# Patient Record
Sex: Male | Born: 1945 | Race: White | Hispanic: No | Marital: Married | Smoking: Former smoker
Health system: Southern US, Community
[De-identification: ages and names within clinical notes are randomized; demographics above are authoritative.]

## PROBLEM LIST (undated history)

## (undated) DIAGNOSIS — T8859XA Other complications of anesthesia, initial encounter: Secondary | ICD-10-CM

## (undated) DIAGNOSIS — J189 Pneumonia, unspecified organism: Secondary | ICD-10-CM

## (undated) DIAGNOSIS — M1990 Unspecified osteoarthritis, unspecified site: Secondary | ICD-10-CM

## (undated) DIAGNOSIS — C61 Malignant neoplasm of prostate: Secondary | ICD-10-CM

## (undated) DIAGNOSIS — E119 Type 2 diabetes mellitus without complications: Secondary | ICD-10-CM

## (undated) DIAGNOSIS — T4145XA Adverse effect of unspecified anesthetic, initial encounter: Secondary | ICD-10-CM

## (undated) DIAGNOSIS — S52501A Unspecified fracture of the lower end of right radius, initial encounter for closed fracture: Secondary | ICD-10-CM

## (undated) HISTORY — PX: TONSILLECTOMY AND ADENOIDECTOMY: SUR1326

## (undated) HISTORY — PX: PROSTATECTOMY: SHX69

## (undated) HISTORY — PX: CERVICAL SPINE SURGERY: SHX589

---

## 1998-02-25 ENCOUNTER — Other Ambulatory Visit: Admission: RE | Admit: 1998-02-25 | Discharge: 1998-02-25 | Payer: Self-pay | Admitting: Urology

## 2000-10-13 ENCOUNTER — Emergency Department (HOSPITAL_COMMUNITY): Admission: EM | Admit: 2000-10-13 | Discharge: 2000-10-13 | Payer: Self-pay | Admitting: Emergency Medicine

## 2001-04-06 ENCOUNTER — Emergency Department (HOSPITAL_COMMUNITY): Admission: EM | Admit: 2001-04-06 | Discharge: 2001-04-06 | Payer: Self-pay | Admitting: Emergency Medicine

## 2001-04-25 ENCOUNTER — Ambulatory Visit: Admission: RE | Admit: 2001-04-25 | Discharge: 2001-07-24 | Payer: Self-pay | Admitting: Radiation Oncology

## 2002-07-02 ENCOUNTER — Emergency Department (HOSPITAL_COMMUNITY): Admission: EM | Admit: 2002-07-02 | Discharge: 2002-07-02 | Payer: Self-pay | Admitting: Emergency Medicine

## 2002-10-09 ENCOUNTER — Emergency Department (HOSPITAL_COMMUNITY): Admission: EM | Admit: 2002-10-09 | Discharge: 2002-10-09 | Payer: Self-pay | Admitting: Emergency Medicine

## 2002-12-01 ENCOUNTER — Emergency Department (HOSPITAL_COMMUNITY): Admission: EM | Admit: 2002-12-01 | Discharge: 2002-12-01 | Payer: Self-pay | Admitting: Emergency Medicine

## 2003-02-07 ENCOUNTER — Emergency Department (HOSPITAL_COMMUNITY): Admission: EM | Admit: 2003-02-07 | Discharge: 2003-02-07 | Payer: Self-pay | Admitting: Emergency Medicine

## 2003-05-22 ENCOUNTER — Emergency Department (HOSPITAL_COMMUNITY): Admission: EM | Admit: 2003-05-22 | Discharge: 2003-05-22 | Payer: Self-pay | Admitting: Emergency Medicine

## 2003-05-29 ENCOUNTER — Emergency Department (HOSPITAL_COMMUNITY): Admission: EM | Admit: 2003-05-29 | Discharge: 2003-05-29 | Payer: Self-pay | Admitting: Emergency Medicine

## 2003-07-27 ENCOUNTER — Emergency Department (HOSPITAL_COMMUNITY): Admission: EM | Admit: 2003-07-27 | Discharge: 2003-07-27 | Payer: Self-pay | Admitting: Emergency Medicine

## 2003-08-12 ENCOUNTER — Emergency Department (HOSPITAL_COMMUNITY): Admission: EM | Admit: 2003-08-12 | Discharge: 2003-08-12 | Payer: Self-pay

## 2003-09-08 ENCOUNTER — Emergency Department (HOSPITAL_COMMUNITY): Admission: EM | Admit: 2003-09-08 | Discharge: 2003-09-08 | Payer: Self-pay | Admitting: Emergency Medicine

## 2003-12-16 ENCOUNTER — Emergency Department (HOSPITAL_COMMUNITY): Admission: EM | Admit: 2003-12-16 | Discharge: 2003-12-16 | Payer: Self-pay | Admitting: Emergency Medicine

## 2004-04-23 ENCOUNTER — Emergency Department (HOSPITAL_COMMUNITY): Admission: EM | Admit: 2004-04-23 | Discharge: 2004-04-23 | Payer: Self-pay | Admitting: Emergency Medicine

## 2004-05-05 ENCOUNTER — Emergency Department (HOSPITAL_COMMUNITY): Admission: EM | Admit: 2004-05-05 | Discharge: 2004-05-05 | Payer: Self-pay | Admitting: Emergency Medicine

## 2004-06-06 ENCOUNTER — Emergency Department (HOSPITAL_COMMUNITY): Admission: EM | Admit: 2004-06-06 | Discharge: 2004-06-06 | Payer: Self-pay | Admitting: Emergency Medicine

## 2014-08-22 ENCOUNTER — Emergency Department (HOSPITAL_COMMUNITY)
Admission: EM | Admit: 2014-08-22 | Discharge: 2014-08-22 | Disposition: A | Discharge status: Home / Self Care | Payer: 59 | Attending: Emergency Medicine | Admitting: Emergency Medicine

## 2014-08-22 ENCOUNTER — Emergency Department (HOSPITAL_COMMUNITY): Payer: 59

## 2014-08-22 ENCOUNTER — Encounter (HOSPITAL_COMMUNITY): Payer: Self-pay | Admitting: *Deleted

## 2014-08-22 DIAGNOSIS — E119 Type 2 diabetes mellitus without complications: Secondary | ICD-10-CM | POA: Insufficient documentation

## 2014-08-22 DIAGNOSIS — Y9289 Other specified places as the place of occurrence of the external cause: Secondary | ICD-10-CM | POA: Diagnosis not present

## 2014-08-22 DIAGNOSIS — Y998 Other external cause status: Secondary | ICD-10-CM | POA: Diagnosis not present

## 2014-08-22 DIAGNOSIS — S61217A Laceration without foreign body of left little finger without damage to nail, initial encounter: Secondary | ICD-10-CM | POA: Insufficient documentation

## 2014-08-22 DIAGNOSIS — Y288XXA Contact with other sharp object, undetermined intent, initial encounter: Secondary | ICD-10-CM | POA: Diagnosis not present

## 2014-08-22 DIAGNOSIS — Y93H2 Activity, gardening and landscaping: Secondary | ICD-10-CM | POA: Diagnosis not present

## 2014-08-22 DIAGNOSIS — Z23 Encounter for immunization: Secondary | ICD-10-CM | POA: Diagnosis not present

## 2014-08-22 DIAGNOSIS — Z8546 Personal history of malignant neoplasm of prostate: Secondary | ICD-10-CM | POA: Diagnosis not present

## 2014-08-22 DIAGNOSIS — S61219A Laceration without foreign body of unspecified finger without damage to nail, initial encounter: Secondary | ICD-10-CM

## 2014-08-22 HISTORY — DX: Malignant neoplasm of prostate: C61

## 2014-08-22 HISTORY — DX: Type 2 diabetes mellitus without complications: E11.9

## 2014-08-22 MED ORDER — LIDOCAINE HCL (PF) 1 % IJ SOLN
10.0000 mL | Freq: Once | INTRAMUSCULAR | Status: AC
  Administered 2014-08-22: 5 mL
  Filled 2014-08-22: qty 10

## 2014-08-22 MED ORDER — TETANUS-DIPHTH-ACELL PERTUSSIS 5-2.5-18.5 LF-MCG/0.5 IM SUSP
0.5000 mL | Freq: Once | INTRAMUSCULAR | Status: AC
  Administered 2014-08-22: 0.5 mL via INTRAMUSCULAR
  Filled 2014-08-22: qty 0.5

## 2014-08-22 NOTE — ED Provider Notes (Signed)
CSN: [id number]     Arrival date & time 08/22/14  2355 History   First MD Initiated Contact with Patient 08/22/14 [phone]     No chief complaint on file.    (Consider location/radiation/quality/duration/timing/severity/associated sxs/prior Treatment) HPI Comments: Steven Bishop is a 69 y.o. male with a PMHx of DM2 and prostate cancer, who presents to the ED with complaints of left pinky lac that occurred at 7:42 AM while he was trimming his bushes with a metal utility blade. He denies any pain at this time, and reports that the bleeding was well controlled with pressure and there was never any squirting blood. He is not on any blood thinners and denies having any bleeding conditions. Denies any loss of range of motion, numbness, tingling, weakness, fall or head injury, loss of consciousness, dizziness or lightheadedness, chest pain or shortness of breath. He is right-handed. He is not up-to-date on his tetanus as he does not recall the last tetanus he had.  Patient is a 69 y.o. male presenting with skin laceration. The history is provided by the patient. No language interpreter was used.  Laceration Location:  Finger Finger laceration location:  L little finger Length (cm):  ~1.5cm Depth:  Cutaneous Quality: straight   Bleeding: controlled with pressure   Time since incident:  1 hour Laceration mechanism:  Razor Pain details:    Severity:  No pain Foreign body present:  No foreign bodies Relieved by:  Pressure Worsened by:  Nothing tried Ineffective treatments:  None tried Tetanus status:  Out of date   No past medical history on file. No past surgical history on file. No family history on file. History  Substance Use Topics  . Smoking status: Not on file  . Smokeless tobacco: Not on file  . Alcohol Use: Not on file    Review of Systems  Respiratory: Negative for shortness of breath.   Cardiovascular: Negative for chest pain.  Musculoskeletal: Negative for myalgias and  arthralgias.  Skin: Positive for wound.  Allergic/Immunologic: Positive for immunocompromised state (diabetic).  Neurological: Negative for dizziness, syncope, weakness, light-headedness and numbness.  Hematological: Does not bruise/bleed easily.   10 Systems reviewed and are negative for acute change except as noted in the HPI.    Allergies  Review of patient's allergies indicates not on file.  Home Medications   Prior to Admission medications   Not on File   BP 120/67 mmHg  Pulse 58  Temp(Src) 98.2 F (36.8 C) (Oral)  Resp 16  Ht 5\' 11"  (1.803 m)  Wt 204 lb (92.534 kg)  BMI 28.46 kg/m2  SpO2 99% Physical Exam  Constitutional: He is oriented to person, place, and time. Vital signs are normal. He appears well-developed and well-nourished.  Non-toxic appearance. No distress.  Afebrile, nontoxic, NAD  HENT:  Head: Normocephalic and atraumatic.  Mouth/Throat: Mucous membranes are normal.  Eyes: Conjunctivae and EOM are normal. Right eye exhibits no discharge. Left eye exhibits no discharge.  Neck: Normal range of motion. Neck supple.  Cardiovascular: Normal rate and intact distal pulses.   Pulmonary/Chest: Effort normal. No respiratory distress.  Abdominal: Normal appearance. He exhibits no distension.  Musculoskeletal: Normal range of motion.       Left hand: He exhibits laceration. He exhibits normal range of motion, no tenderness, no bony tenderness, normal two-point discrimination, normal capillary refill and no swelling. Normal sensation noted. Normal strength noted.       Hands: L 5th digit with FROM intact at PIP, MCP, and  DIP joints, some crepitus/laxity noted at PIP joint, with ~2cm laceration just proximal to PIP joint which extends ~84mm deep, no ongoing bleeding. No swelling, no TTP, sensation and strength grossly intact, distal pulses intact, cap refill brisk and present. SEE PICTURE BELOW  Neurological: He is alert and oriented to person, place, and time. He has  normal strength. No sensory deficit.  Skin: Skin is warm and dry. Laceration noted. No rash noted.  L 5th digit lac as noted above, and depicted below  Psychiatric: He has a normal mood and affect.  Nursing note and vitals reviewed.     ED Course  LACERATION REPAIR Date/Time: 08/22/2014 10:05 AM Performed by: Fleet Contras Authorized by: Zacarias Pontes Consent: Verbal consent obtained. Risks and benefits: risks, benefits and alternatives were discussed Consent given by: patient Patient understanding: patient states understanding of the procedure being performed Patient consent: the patient's understanding of the procedure matches consent given Patient identity confirmed: verbally with patient Body area: upper extremity Location details: left small finger Laceration length: 2 cm Foreign bodies: no foreign bodies Tendon involvement: none Nerve involvement: none Vascular damage: no Anesthesia: local infiltration Local anesthetic: lidocaine 1% without epinephrine Anesthetic total: 1 ml Patient sedated: no Preparation: Patient was prepped and draped in the usual sterile fashion. Irrigation solution: saline Irrigation method: syringe Amount of cleaning: extensive Debridement: none Degree of undermining: none Skin closure: 4-0 Prolene Number of sutures: 4 Technique: simple Approximation: close Approximation difficulty: simple Dressing: splint and 4x4 sterile gauze Patient tolerance: Patient tolerated the procedure well with no immediate complications   (including critical care time) Labs Review Labs Reviewed - No data to display  Imaging Review Dg Finger Little Left  08/22/2014   CLINICAL DATA:  Extremity laceration.  Small finger laceration.  EXAM: LEFT LITTLE FINGER 2+V  COMPARISON:  None.  FINDINGS: Soft tissue defect is present along the ulnar aspect of the proximal phalanx of the LEFT small finger. No osseous injury or radiopaque foreign body. Alignment of the  small finger appears normal.  IMPRESSION: Laceration in the ulnar aspect of the LEFT small finger.   Electronically Signed   By: Dereck Ligas M.D.   On: 08/22/2014 09:30     EKG Interpretation None      MDM   Final diagnoses:  Finger laceration, initial encounter    69 y.o. male here with L pinky laceration over PIP joint, some crepitus to the PIP joint now. Some oozing still. FROM intact and neurovascularly intact. Will obtain imaging and update tetanus. Pt denies pain at this time. Will monitor.  10:06 AM Xray negative and without retained FB, wound irrigated with copious amounts of NS, does not involve any deep structures. Sutured close by student with my supervision, using 4 sutures to ensure area would not re-open since it's so close to a joint. Will splint to ensure sutures are stable during healing. Discussed f/up in 7-10 days with his PCP or [facility]. Doubt need for abx since we copiously irrigated it, but strict return precautions given especially since pt is diabetic. Discussed ice and elevation for comfort, as well as tylenol and motrin for pain. I explained the diagnosis and have given explicit precautions to return to the ER including for any other new or worsening symptoms. The patient understands and accepts the medical plan as it's been dictated and I have answered their questions. Discharge instructions concerning home care and prescriptions have been given. The patient is STABLE and is discharged to home in good condition.  BP 120/67 mmHg  Pulse 58  Temp(Src) 98.2 F (36.8 C) (Oral)  Resp 16  Ht 5\' 11"  (1.803 m)  Wt 204 lb (92.534 kg)  BMI 28.46 kg/m2  SpO2 99%  Meds ordered this encounter  Medications  . Tdap (BOOSTRIX) injection 0.5 mL    Sig:   . lidocaine (PF) (XYLOCAINE) 1 % injection 10 mL    Sig:      Daking Westervelt Camprubi-Soms, PA-C 08/22/14 1011  Serita Grit, MD 08/26/14 [phone]

## 2014-08-22 NOTE — ED Notes (Signed)
Pt c/o laceration to left 5th finger while trimming bushes this am.

## 2014-08-22 NOTE — Progress Notes (Signed)
Orthopedic Tech Progress Note Patient Details:  Steven Bishop 11/13/1945 [id number]  Ortho Devices Type of Ortho Device: Finger splint Ortho Device/Splint Interventions: Application   Cammer, Theodoro Parma 08/22/2014, 12:51 PM

## 2014-08-22 NOTE — Discharge Instructions (Signed)
Keep wound clean with mild soap and water. Keep area covered with a topical antibiotic ointment and bandage, keep bandage dry, and do not submerge in water for 24 hours. Use the splint at all times aside from showering in order to avoid the stitches from coming apart. Ice and elevate for additional pain relief and swelling. Alternate between Naprosyn and Tylenol for additional pain relief. Follow up with your primary care doctor or the [facility] in approximately 7-10 days for wound recheck and suture removal. Monitor area for signs of infection to include, but not limited to: increasing pain, redness, drainage/pus, or swelling. Return to emergency department for emergent changing or worsening symptoms.    Laceration Care, Adult A laceration is a cut that goes through all layers of the skin. The cut goes into the tissue beneath the skin. HOME CARE For stitches (sutures) or staples:  Keep the cut clean and dry.  If you have a bandage (dressing), change it at least once a day. Change the bandage if it gets wet or dirty, or as told by your doctor.  Wash the cut with soap and water 2 times a day. Rinse the cut with water. Pat it dry with a clean towel.  Put a thin layer of medicated cream on the cut as told by your doctor.  You may shower after the first 24 hours. Do not soak the cut in water until the stitches are removed.  Only take medicines as told by your doctor.  Have your stitches or staples removed as told by your doctor. For skin adhesive strips:  Keep the cut clean and dry.  Do not get the strips wet. You may take a bath, but be careful to keep the cut dry.  If the cut gets wet, pat it dry with a clean towel.  The strips will fall off on their own. Do not remove the strips that are still stuck to the cut. For wound glue:  You may shower or take baths. Do not soak or scrub the cut. Do not swim. Avoid heavy sweating until the glue falls off on its own. After a  shower or bath, pat the cut dry with a clean towel.  Do not put medicine on your cut until the glue falls off.  If you have a bandage, do not put tape over the glue.  Avoid lots of sunlight or tanning lamps until the glue falls off. Put sunscreen on the cut for the first year to reduce your scar.  The glue will fall off on its own. Do not pick at the glue. You may need a tetanus shot if:  You cannot remember when you had your last tetanus shot.  You have never had a tetanus shot. If you need a tetanus shot and you choose not to have one, you may get tetanus. Sickness from tetanus can be serious. GET HELP RIGHT AWAY IF:   Your pain does not get better with medicine.  Your arm, hand, leg, or foot loses feeling (numbness) or changes color.  Your cut is bleeding.  Your joint feels weak, or you cannot use your joint.  You have painful lumps on your body.  Your cut is red, puffy (swollen), or painful.  You have a red line on the skin near the cut.  You have yellowish-white fluid (pus) coming from the cut.  You have a fever.  You have a bad smell coming from the cut or bandage.  Your cut  breaks open before or after stitches are removed.  You notice something coming out of the cut, such as wood or glass.  You cannot move a finger or toe. MAKE SURE YOU:   Understand these instructions.  Will watch your condition.  Will get help right away if you are not doing well or get worse. Document Released: 09/19/2007 Document Revised: 06/25/2011 Document Reviewed: 09/26/2010 [facility] Patient Information 2015 [place], [place]. This information is not intended to replace advice given to you by your health care provider. Make sure you discuss any questions you have with your health care provider.  Stitches, Staples, or Skin Adhesive Strips  Stitches (sutures), staples, and skin adhesive strips hold the skin together as it heals. They will usually be in place for 7 days or less. HOME  CARE  Wash your hands with soap and water before and after you touch your wound.  Only take medicine as told by your doctor.  Cover your wound only if your doctor told you to. Otherwise, leave it open to air.  Do not get your stitches wet or dirty. If they get dirty, dab them gently with a clean washcloth. Wet the washcloth with soapy water. Do not rub. Pat them dry gently.  Do not put medicine or medicated cream on your stitches unless your doctor told you to.  Do not take out your own stitches or staples. Skin adhesive strips will fall off by themselves.  Do not pick at the wound. Picking can cause an infection.  Do not miss your follow-up appointment.  If you have problems or questions, call your doctor. GET HELP RIGHT AWAY IF:   You have a temperature by mouth above 102 F (38.9 C), not controlled by medicine.  You have chills.  You have redness or pain around your stitches.  There is puffiness (swelling) around your stitches.  You notice fluid (drainage) from your stitches.  There is a bad smell coming from your wound. MAKE SURE YOU:  Understand these instructions.  Will watch your condition.  Will get help if you are not doing well or get worse. Document Released: 01/28/2009 Document Revised: 06/25/2011 Document Reviewed: 01/28/2009 [facility] Patient Information 2015 [place], [place]. This information is not intended to replace advice given to you by your health care provider. Make sure you discuss any questions you have with your health care provider.  Wound Care Wound care helps prevent pain and infection.  You may need a tetanus shot if:  You cannot remember when you had your last tetanus shot.  You have never had a tetanus shot.  The injury broke your skin. If you need a tetanus shot and you choose not to have one, you may get tetanus. Sickness from tetanus can be serious. HOME CARE   Only take medicine as told by your doctor.  Clean the wound daily  with mild soap and water.  Change any bandages (dressings) as told by your doctor.  Put medicated cream and a bandage on the wound as told by your doctor.  Change the bandage if it gets wet, dirty, or starts to smell.  Take showers. Do not take baths, swim, or do anything that puts your wound under water.  Rest and raise (elevate) the wound until the pain and puffiness (swelling) are better.  Keep all doctor visits as told. GET HELP RIGHT AWAY IF:   Yellowish-white fluid (pus) comes from the wound.  Medicine does not lessen your pain.  There is a red streak going  away from the wound.  You have a fever. MAKE SURE YOU:   Understand these instructions.  Will watch your condition.  Will get help right away if you are not doing well or get worse. Document Released: 01/10/2008 Document Revised: 06/25/2011 Document Reviewed: 08/06/2010 [facility] Patient Information 2015 [place], [place]. This information is not intended to replace advice given to you by your health care provider. Make sure you discuss any questions you have with your health care provider.

## 2015-01-10 ENCOUNTER — Encounter (HOSPITAL_COMMUNITY): Payer: Self-pay | Admitting: *Deleted

## 2015-01-10 ENCOUNTER — Other Ambulatory Visit: Payer: Self-pay | Admitting: Orthopedic Surgery

## 2015-01-10 NOTE — Progress Notes (Signed)
Pt denies SOB, chest pain, and being under the care of a cardiologist. Pt denies having a stress test, echo and cardiac cath. Pt denies having an EKG and chest x ray within the last year. Pt made aware to stop taking  Aspirin, otc vitamins and herbal medications. Do not take any NSAIDs ie: Ibuprofen, Advil, Naproxen or any medication containing Aspirin. Pt verbalized understanding of all pre-op instructions. 

## 2015-01-11 ENCOUNTER — Ambulatory Visit (HOSPITAL_COMMUNITY): Payer: 59 | Admitting: Anesthesiology

## 2015-01-11 ENCOUNTER — Ambulatory Visit (HOSPITAL_COMMUNITY)
Admission: RE | Admit: 2015-01-11 | Discharge: 2015-01-11 | Disposition: A | Discharge status: Home / Self Care | Payer: 59 | Source: Ambulatory Visit | Attending: Orthopedic Surgery | Admitting: Orthopedic Surgery

## 2015-01-11 ENCOUNTER — Encounter (HOSPITAL_COMMUNITY): Payer: Self-pay | Admitting: Surgery

## 2015-01-11 ENCOUNTER — Encounter (HOSPITAL_COMMUNITY)
Admission: RE | Disposition: A | Discharge status: Home / Self Care | Payer: Self-pay | Source: Ambulatory Visit | Attending: Orthopedic Surgery

## 2015-01-11 DIAGNOSIS — S52571A Other intraarticular fracture of lower end of right radius, initial encounter for closed fracture: Secondary | ICD-10-CM | POA: Diagnosis not present

## 2015-01-11 DIAGNOSIS — E119 Type 2 diabetes mellitus without complications: Secondary | ICD-10-CM | POA: Insufficient documentation

## 2015-01-11 DIAGNOSIS — Z8546 Personal history of malignant neoplasm of prostate: Secondary | ICD-10-CM | POA: Diagnosis not present

## 2015-01-11 DIAGNOSIS — S52611A Displaced fracture of right ulna styloid process, initial encounter for closed fracture: Secondary | ICD-10-CM | POA: Insufficient documentation

## 2015-01-11 DIAGNOSIS — Z791 Long term (current) use of non-steroidal anti-inflammatories (NSAID): Secondary | ICD-10-CM | POA: Diagnosis not present

## 2015-01-11 DIAGNOSIS — X58XXXA Exposure to other specified factors, initial encounter: Secondary | ICD-10-CM | POA: Insufficient documentation

## 2015-01-11 DIAGNOSIS — Z87891 Personal history of nicotine dependence: Secondary | ICD-10-CM | POA: Insufficient documentation

## 2015-01-11 DIAGNOSIS — Z79899 Other long term (current) drug therapy: Secondary | ICD-10-CM | POA: Diagnosis not present

## 2015-01-11 HISTORY — DX: Other complications of anesthesia, initial encounter: T88.59XA

## 2015-01-11 HISTORY — DX: Adverse effect of unspecified anesthetic, initial encounter: T41.45XA

## 2015-01-11 HISTORY — DX: Unspecified fracture of the lower end of right radius, initial encounter for closed fracture: S52.501A

## 2015-01-11 HISTORY — PX: ORIF RADIAL FRACTURE: SHX5113

## 2015-01-11 LAB — BASIC METABOLIC PANEL
Anion gap: 11 (ref 5–15)
BUN: 16 mg/dL (ref 6–20)
CO2: 18 mmol/L — ABNORMAL LOW (ref 22–32)
CREATININE: 0.81 mg/dL (ref 0.61–1.24)
Calcium: 8.4 mg/dL — ABNORMAL LOW (ref 8.9–10.3)
Chloride: 108 mmol/L (ref 101–111)
Glucose, Bld: 85 mg/dL (ref 65–99)
Potassium: 4 mmol/L (ref 3.5–5.1)
SODIUM: 137 mmol/L (ref 135–145)

## 2015-01-11 LAB — CBC
HCT: 33 % — ABNORMAL LOW (ref 39.0–52.0)
HEMOGLOBIN: 11.2 g/dL — AB (ref 13.0–17.0)
MCH: 31.7 pg (ref 26.0–34.0)
MCHC: 33.9 g/dL (ref 30.0–36.0)
MCV: 93.5 fL (ref 78.0–100.0)
PLATELETS: 132 10*3/uL — AB (ref 150–400)
RBC: 3.53 MIL/uL — ABNORMAL LOW (ref 4.22–5.81)
RDW: 12.7 % (ref 11.5–15.5)
WBC: 5.1 10*3/uL (ref 4.0–10.5)

## 2015-01-11 LAB — GLUCOSE, CAPILLARY
GLUCOSE-CAPILLARY: 91 mg/dL (ref 65–99)
GLUCOSE-CAPILLARY: 86 mg/dL (ref 65–99)

## 2015-01-11 SURGERY — OPEN REDUCTION INTERNAL FIXATION (ORIF) RADIAL FRACTURE
Anesthesia: Monitor Anesthesia Care | Site: Wrist | Laterality: Right

## 2015-01-11 MED ORDER — MEPERIDINE HCL 25 MG/ML IJ SOLN: 6.2500 mg | INTRAMUSCULAR | Status: DC | PRN

## 2015-01-11 MED ORDER — PROPOFOL 500 MG/50ML IV EMUL
INTRAVENOUS | Status: DC | PRN
  Administered 2015-01-11: 50 ug/kg/min via INTRAVENOUS

## 2015-01-11 MED ORDER — METHOCARBAMOL 500 MG PO TABS: 500.0000 mg | ORAL_TABLET | Freq: Three times a day (TID) | ORAL | Status: DC | PRN

## 2015-01-11 MED ORDER — CEFAZOLIN SODIUM-DEXTROSE 2-3 GM-% IV SOLR
2.0000 g | INTRAVENOUS | Status: DC
Start: 2015-01-11 — End: 2015-01-12

## 2015-01-11 MED ORDER — MIDAZOLAM HCL 2 MG/2ML IJ SOLN
INTRAMUSCULAR | Status: AC
  Filled 2015-01-11: qty 4

## 2015-01-11 MED ORDER — ONDANSETRON HCL 4 MG/2ML IJ SOLN
INTRAMUSCULAR | Status: DC | PRN
  Administered 2015-01-11: 4 mg via INTRAVENOUS

## 2015-01-11 MED ORDER — HYDROMORPHONE HCL 1 MG/ML IJ SOLN: 0.2500 mg | INTRAMUSCULAR | Status: DC | PRN

## 2015-01-11 MED ORDER — 0.9 % SODIUM CHLORIDE (POUR BTL) OPTIME
TOPICAL | Status: DC | PRN
  Administered 2015-01-11: 1000 mL

## 2015-01-11 MED ORDER — BUPIVACAINE HCL (PF) 0.25 % IJ SOLN
INTRAMUSCULAR | Status: AC
  Filled 2015-01-11: qty 30

## 2015-01-11 MED ORDER — BUPIVACAINE-EPINEPHRINE (PF) 0.5% -1:200000 IJ SOLN
INTRAMUSCULAR | Status: DC | PRN
  Administered 2015-01-11: 30 mL via PERINEURAL

## 2015-01-11 MED ORDER — PROMETHAZINE HCL 25 MG/ML IJ SOLN: 6.2500 mg | INTRAMUSCULAR | Status: DC | PRN

## 2015-01-11 MED ORDER — PROPOFOL 10 MG/ML IV BOLUS
INTRAVENOUS | Status: DC | PRN
  Administered 2015-01-11: 50 mg via INTRAVENOUS
  Administered 2015-01-11: 100 mg via INTRAVENOUS

## 2015-01-11 MED ORDER — PROPOFOL 10 MG/ML IV BOLUS
INTRAVENOUS | Status: AC
  Filled 2015-01-11: qty 20

## 2015-01-11 MED ORDER — FENTANYL CITRATE (PF) 250 MCG/5ML IJ SOLN
INTRAMUSCULAR | Status: DC | PRN
  Administered 2015-01-11: 100 ug via INTRAVENOUS

## 2015-01-11 MED ORDER — FENTANYL CITRATE (PF) 250 MCG/5ML IJ SOLN
INTRAMUSCULAR | Status: AC
  Filled 2015-01-11: qty 5

## 2015-01-11 MED ORDER — CEPHALEXIN 500 MG PO CAPS: 500.0000 mg | ORAL_CAPSULE | Freq: Four times a day (QID) | ORAL | Status: DC

## 2015-01-11 MED ORDER — LACTATED RINGERS IV SOLN
INTRAVENOUS | Status: DC
Start: 2015-01-11 — End: 2015-01-12

## 2015-01-11 MED ORDER — LIDOCAINE HCL (CARDIAC) 20 MG/ML IV SOLN
INTRAVENOUS | Status: AC
  Filled 2015-01-11: qty 5

## 2015-01-11 MED ORDER — CHLORHEXIDINE GLUCONATE 4 % EX LIQD: 60.0000 mL | Freq: Once | CUTANEOUS | Status: DC

## 2015-01-11 MED ORDER — FENTANYL CITRATE (PF) 100 MCG/2ML IJ SOLN
INTRAMUSCULAR | Status: AC
  Administered 2015-01-11: 100 ug
  Filled 2015-01-11: qty 2

## 2015-01-11 MED ORDER — MIDAZOLAM HCL 2 MG/2ML IJ SOLN
INTRAMUSCULAR | Status: AC
  Administered 2015-01-11: 2 mg
  Filled 2015-01-11: qty 2

## 2015-01-11 MED ORDER — LACTATED RINGERS IV SOLN
INTRAVENOUS | Status: DC | PRN
  Administered 2015-01-11 (×2): via INTRAVENOUS

## 2015-01-11 MED ORDER — CEFAZOLIN SODIUM-DEXTROSE 2-3 GM-% IV SOLR
INTRAVENOUS | Status: AC
  Administered 2015-01-11: 2 g via INTRAVENOUS
  Filled 2015-01-11: qty 50

## 2015-01-11 MED ORDER — OXYCODONE HCL 5 MG PO TABS: 10.0000 mg | ORAL_TABLET | ORAL | Status: DC | PRN

## 2015-01-11 MED ORDER — MIDAZOLAM HCL 2 MG/2ML IJ SOLN
INTRAMUSCULAR | Status: DC | PRN
  Administered 2015-01-11: 2 mg via INTRAVENOUS

## 2015-01-11 SURGICAL SUPPLY — 63 items
BANDAGE ELASTIC 3 VELCRO ST LF (GAUZE/BANDAGES/DRESSINGS) ×3 IMPLANT
BANDAGE ELASTIC 4 VELCRO ST LF (GAUZE/BANDAGES/DRESSINGS) ×3 IMPLANT
BIT DRILL 2.2 SS TIBIAL (BIT) ×2 IMPLANT
BLADE SURG ROTATE 9660 (MISCELLANEOUS) IMPLANT
BNDG CMPR 9X4 STRL LF SNTH (GAUZE/BANDAGES/DRESSINGS) ×1
BNDG ESMARK 4X9 LF (GAUZE/BANDAGES/DRESSINGS) ×3 IMPLANT
BNDG GAUZE ELAST 4 BULKY (GAUZE/BANDAGES/DRESSINGS) ×9 IMPLANT
CORDS BIPOLAR (ELECTRODE) ×3 IMPLANT
COVER SURGICAL LIGHT HANDLE (MISCELLANEOUS) ×3 IMPLANT
CUFF TOURNIQUET SINGLE 18IN (TOURNIQUET CUFF) ×3 IMPLANT
CUFF TOURNIQUET SINGLE 24IN (TOURNIQUET CUFF) IMPLANT
DECANTER SPIKE VIAL GLASS SM (MISCELLANEOUS) ×2 IMPLANT
DRAIN TLS ROUND 10FR (DRAIN) IMPLANT
DRAPE OEC MINIVIEW 54X84 (DRAPES) ×2 IMPLANT
DRAPE U-SHAPE 47X51 STRL (DRAPES) ×3 IMPLANT
DRSG ADAPTIC 3X8 NADH LF (GAUZE/BANDAGES/DRESSINGS) ×3 IMPLANT
GAUZE SPONGE 4X4 12PLY STRL (GAUZE/BANDAGES/DRESSINGS) ×3 IMPLANT
GAUZE XEROFORM 5X9 LF (GAUZE/BANDAGES/DRESSINGS) ×3 IMPLANT
GLOVE ORTHO TXT STRL SZ7.5 (GLOVE) ×3 IMPLANT
GLOVE SS BIOGEL STRL SZ 8 (GLOVE) ×1 IMPLANT
GLOVE SUPERSENSE BIOGEL SZ 8 (GLOVE) ×2
GOWN STRL REUS W/ TWL LRG LVL3 (GOWN DISPOSABLE) ×3 IMPLANT
GOWN STRL REUS W/ TWL XL LVL3 (GOWN DISPOSABLE) ×3 IMPLANT
GOWN STRL REUS W/TWL LRG LVL3 (GOWN DISPOSABLE) ×9
GOWN STRL REUS W/TWL XL LVL3 (GOWN DISPOSABLE) ×9
KIT BASIN OR (CUSTOM PROCEDURE TRAY) ×3 IMPLANT
KIT ROOM TURNOVER OR (KITS) ×3 IMPLANT
LOOP VESSEL MAXI BLUE (MISCELLANEOUS) IMPLANT
MANIFOLD NEPTUNE II (INSTRUMENTS) ×3 IMPLANT
NEEDLE 22X1 1/2 (OR ONLY) (NEEDLE) IMPLANT
NS IRRIG 1000ML POUR BTL (IV SOLUTION) ×3 IMPLANT
PACK ORTHO EXTREMITY (CUSTOM PROCEDURE TRAY) ×3 IMPLANT
PAD ARMBOARD 7.5X6 YLW CONV (MISCELLANEOUS) ×6 IMPLANT
PAD CAST 4YDX4 CTTN HI CHSV (CAST SUPPLIES) ×1 IMPLANT
PADDING CAST COTTON 4X4 STRL (CAST SUPPLIES) ×3
PEG LOCKING SMOOTH 2.2X16 (Screw) ×4 IMPLANT
PEG LOCKING SMOOTH 2.2X18 (Peg) ×2 IMPLANT
PEG LOCKING SMOOTH 2.2X20 (Screw) ×2 IMPLANT
PEG LOCKING SMOOTH 2.2X22 (Screw) ×4 IMPLANT
PEG LOCKING SMOOTH 2.2X24 (Peg) ×2 IMPLANT
PLATE CROSSLOCK MINI RT (Plate) ×2 IMPLANT
SCREW LOCK 16X2.7X 3 LD TPR (Screw) IMPLANT
SCREW LOCK 18X2.7X 3 LD TPR (Screw) IMPLANT
SCREW LOCK 20X2.7X 3 LD TPR (Screw) IMPLANT
SCREW LOCKING 2.7X15MM (Screw) ×2 IMPLANT
SCREW LOCKING 2.7X16 (Screw) ×3 IMPLANT
SCREW LOCKING 2.7X18 (Screw) ×6 IMPLANT
SCREW LOCKING 2.7X20MM (Screw) ×3 IMPLANT
SPLINT FIBERGLASS 4X30 (CAST SUPPLIES) ×2 IMPLANT
SPONGE LAP 4X18 X RAY DECT (DISPOSABLE) IMPLANT
SUT MNCRL AB 4-0 PS2 18 (SUTURE) ×3 IMPLANT
SUT PROLENE 3 0 PS 2 (SUTURE) IMPLANT
SUT PROLENE 4 0 P 3 18 (SUTURE) ×4 IMPLANT
SUT VIC AB 3-0 FS2 27 (SUTURE) ×4 IMPLANT
SYR CONTROL 10ML LL (SYRINGE) IMPLANT
SYSTEM CHEST DRAIN TLS 7FR (DRAIN) IMPLANT
TOWEL OR 17X24 6PK STRL BLUE (TOWEL DISPOSABLE) ×3 IMPLANT
TOWEL OR 17X26 10 PK STRL BLUE (TOWEL DISPOSABLE) ×3 IMPLANT
TUBE CONNECTING 12'X1/4 (SUCTIONS) ×1
TUBE CONNECTING 12X1/4 (SUCTIONS) ×2 IMPLANT
TUBE EVACUATION TLS (MISCELLANEOUS) ×3 IMPLANT
UNDERPAD 30X30 INCONTINENT (UNDERPADS AND DIAPERS) ×3 IMPLANT
WATER STERILE IRR 1000ML POUR (IV SOLUTION) ×3 IMPLANT

## 2015-01-11 NOTE — H&P (Signed)
Steven Bishop is an 69 y.o. male.   Chief Complaint: Right radius fracture distal and location  HPI: Patient presents for evaluation and definitive treatment of right distal radius fracture. He has comminuted fracture with some degree of early collapse.  Past Medical History  Diagnosis Date  . Diabetes mellitus   . Complication of anesthesia     " It takes a while for me to wake up."  . Distal radius fracture, right   . Prostate cancer     Past Surgical History  Procedure Laterality Date  . Prostatectomy    . Cervical spine surgery    . Tonsillectomy and adenoidectomy      Family History  Problem Relation Age of Onset  . Macular degeneration Mother    Social History:  reports that he has quit smoking. He has never used smokeless tobacco. He reports that he does not drink alcohol or use illicit drugs.  Allergies: No Known Allergies  Medications Prior to Admission  Medication Sig Dispense Refill  . Ascorbic Acid (VITAMIN C) 100 MG tablet Take 100 mg by mouth daily.    . cholecalciferol (VITAMIN D) 1000 UNITS tablet Take 1,000 Units by mouth daily.    Marland Kitchen ibuprofen (ADVIL,MOTRIN) 800 MG tablet Take 800 mg by mouth 3 (three) times daily as needed.  0  . KRILL OIL PO Take 1 capsule by mouth daily.    . LUTEIN PO Take 1 capsule by mouth daily.    . Omega-3 Fatty Acids (FISH OIL PO) Take 1 capsule by mouth daily.    . pioglitazone (ACTOS) 30 MG tablet Take 30 mg by mouth daily.    . vitamin E 100 UNIT capsule Take 100 Units by mouth daily.      Results for orders placed or performed during the hospital encounter of 01/11/15 (from the past 48 hour(s))  CBC     Status: Abnormal   Collection Time: 01/11/15  3:24 PM  Result Value Ref Range   WBC 5.1 4.0 - 10.5 K/uL   RBC 3.53 (L) 4.22 - 5.81 MIL/uL   Hemoglobin 11.2 (L) 13.0 - 17.0 g/dL   HCT 33.0 (L) 39.0 - 52.0 %   MCV 93.5 78.0 - 100.0 fL   MCH 31.7 26.0 - 34.0 pg   MCHC 33.9 30.0 - 36.0 g/dL   RDW 12.7 11.5 - 15.5 %   Platelets 132 (L) 150 - 400 K/uL  Basic metabolic panel     Status: Abnormal   Collection Time: 01/11/15  3:24 PM  Result Value Ref Range   Sodium 137 135 - 145 mmol/L   Potassium 4.0 3.5 - 5.1 mmol/L   Chloride 108 101 - 111 mmol/L   CO2 18 (L) 22 - 32 mmol/L   Glucose, Bld 85 65 - 99 mg/dL   BUN 16 6 - 20 mg/dL   Creatinine, Ser 0.81 0.61 - 1.24 mg/dL   Calcium 8.4 (L) 8.9 - 10.3 mg/dL   GFR calc non Af Amer >60 >60 mL/min   GFR calc Af Amer >60 >60 mL/min    Comment: (NOTE) The eGFR has been calculated using the CKD EPI equation. This calculation has not been validated in all clinical situations. eGFR's persistently <60 mL/min signify possible Chronic Kidney Disease.    Anion gap 11 5 - 15  Glucose, capillary     Status: None   Collection Time: 01/11/15  3:33 PM  Result Value Ref Range   Glucose-Capillary 91 65 - 99 mg/dL  No results found.  Review of Systems  Eyes: Negative.   Respiratory: Negative.   Gastrointestinal: Negative.   Neurological: Negative.   Endo/Heme/Allergies: Negative.   Psychiatric/Behavioral: Negative.     Blood pressure 126/51, pulse 75, temperature 98.5 F (36.9 C), temperature source Oral, resp. rate 17, height $RemoveBe'5\' 11"'RQhChvlCk$  (1.803 m), weight 92.987 kg (205 lb), SpO2 100 %. Physical Exam  Right distal radius fracture displaced with intra-articular comminution. This patient has no evidence of neurovascular compromise. The patient is alert and oriented in no acute distress. The patient complains of pain in the affected upper extremity.  The patient is noted to have a normal HEENT exam. Lung fields show equal chest expansion and no shortness of breath. Abdomen exam is nontender without distention. Lower extremity examination does not show any fracture dislocation or blood clot symptoms. Pelvis is stable and the neck and back are stable and nontender. Assessment/Plan Will plan for ORIF right distal radius fracture and repair is necessary. I discussed  him risk benefits and we will move accordingly We are planning surgery for your upper extremity. The risk and benefits of surgery to include risk of bleeding, infection, anesthesia,  damage to normal structures and failure of the surgery to accomplish its intended goals of relieving symptoms and restoring function have been discussed in detail. With this in mind we plan to proceed. I have specifically discussed with the patient the pre-and postoperative regime and the dos and don'ts and risk and benefits in great detail. Risk and benefits of surgery also include risk of dystrophy(CRPS), chronic nerve pain, failure of the healing process to go onto completion and other inherent risks of surgery The relavent the pathophysiology of the disease/injury process, as well as the alternatives for treatment and postoperative course of action has been discussed in great detail with the patient who desires to proceed.  We will do everything in our power to help you (the patient) restore function to the upper extremity. It is a pleasure to see this patient today.  Paulene Floor 01/11/2015, 7:13 PM

## 2015-01-11 NOTE — Anesthesia Postprocedure Evaluation (Signed)
  Anesthesia Post-op Note  Patient: Steven Bishop  Procedure(s) Performed: Procedure(s) (LRB): OPEN REDUCTION INTERNAL FIXATION (ORIF) RIGHT RADIAL FRACTURE WITH REPAIR AND RECONSTRUCTION (Right)  Patient Location: PACU  Anesthesia Type: GA combined with regional for post-op pain  Level of Consciousness: awake and alert   Airway and Oxygen Therapy: Patient Spontanous Breathing  Post-op Pain: mild  Post-op Assessment: Post-op Vital signs reviewed, Patient's Cardiovascular Status Stable, Respiratory Function Stable, Patent Airway and No signs of Nausea or vomiting  Last Vitals:  Filed Vitals:   01/11/15 2203  BP:   Pulse: 56  Temp:   Resp: 24    Post-op Vital Signs: stable   Complications: No apparent anesthesia complications

## 2015-01-11 NOTE — Progress Notes (Signed)
Orthopedic Tech Progress Note Patient Details:  Steven Bishop March 10, 1946 [id number]  Ortho Devices Type of Ortho Device: Arm sling Ortho Device/Splint Location: RUE Ortho Device/Splint Interventions: Ordered, Application   Braulio Bosch 01/11/2015, 10:28 PM

## 2015-01-11 NOTE — Discharge Instructions (Signed)
Keep bandage clean and dry.  Call for any problems.  No smoking.  Criteria for driving a car: you should be off your pain medicine for 7-8 hours, able to drive one handed(confident), thinking clearly and feeling able in your judgement to drive. Continue elevation as it will decrease swelling.  If instructed by MD move your fingers within the confines of the bandage/splint.  Use ice if instructed by your MD. Call immediately for any sudden loss of feeling in your hand/arm or change in functional abilities of the extremity.We recommend that you to take vitamin C 1000 mg a day to promote healing. We also recommend that if you require  pain medicine that you take a stool softener to prevent constipation as most pain medicines will have constipation side effects. We recommend either Peri-Colace or Senokot and recommend that you also consider adding MiraLAX to prevent the constipation affects from pain medicine if you are required to use them. These medicines are over the counter and maybe purchased at a local pharmacy. A cup of yogurt and a probiotic can also be helpful during the recovery process as the medicines can disrupt your intestinal environment.    Patient received instruction per MD to remove drain on Post op day 1.

## 2015-01-11 NOTE — Anesthesia Preprocedure Evaluation (Addendum)
Anesthesia Evaluation  Patient identified by MRN, date of birth, ID band Patient awake    Reviewed: Allergy & Precautions, NPO status , Patient's Chart, lab work & pertinent test results  History of Anesthesia Complications Negative for: history of anesthetic complications  Airway Mallampati: I  TM Distance: >3 FB Neck ROM: Full    Dental  (+) Teeth Intact, Dental Advisory Given   Pulmonary neg pulmonary ROS, former smoker,    breath sounds clear to auscultation       Cardiovascular negative cardio ROS   Rhythm:Regular Rate:Normal     Neuro/Psych negative neurological ROS  negative psych ROS   GI/Hepatic negative GI ROS, Neg liver ROS,   Endo/Other  negative endocrine ROSdiabetes  Renal/GU negative Renal ROS     Musculoskeletal negative musculoskeletal ROS (+)   Abdominal   Peds  Hematology negative hematology ROS (+)   Anesthesia Other Findings   Reproductive/Obstetrics negative OB ROS                            Anesthesia Physical Anesthesia Plan  ASA: II  Anesthesia Plan: MAC and Regional   Post-op Pain Management:    Induction: Intravenous  Airway Management Planned:   Additional Equipment:   Intra-op Plan:   Post-operative Plan:   Informed Consent: I have reviewed the patients History and Physical, chart, labs and discussed the procedure including the risks, benefits and alternatives for the proposed anesthesia with the patient or authorized representative who has indicated his/her understanding and acceptance.   Dental advisory given  Plan Discussed with: CRNA  Anesthesia Plan Comments:         Anesthesia Quick Evaluation

## 2015-01-11 NOTE — Op Note (Signed)
See [id number] Amedeo Plenty MD

## 2015-01-11 NOTE — Anesthesia Procedure Notes (Addendum)
Anesthesia Regional Block:  Axillary brachial plexus block  Pre-Anesthetic Checklist: ,, timeout performed, Correct Patient, Correct Site, Correct Laterality, Correct Procedure, Correct Position, site marked, Risks and benefits discussed, Surgical consent,  Pre-op evaluation,  Post-op pain management  Laterality: Right  Prep: chloraprep       Needles:  Injection technique: Single-shot  Needle Type: Stimulator Needle - 40     Needle Length: 4cm 4 cm Needle Gauge: 22 and 22 G    Additional Needles:  Procedures: ultrasound guided (picture in chart) Axillary brachial plexus block Narrative:  Injection made incrementally with aspirations every 5 mL.  Performed by: Personally  Anesthesiologist: Nolon Nations  Additional Notes: BP cuff, EKG monitors applied. Sedation begun. Nerve location verified with U/S. Anesthetic injected incrementally, slowly , and after neg aspirations under direct u/s guidance. Good perineural spread. Tolerated well.   Procedure Name: LMA Insertion Date/Time: 01/11/2015 8:19 PM Performed by: Valetta Fuller Pre-anesthesia Checklist: Patient identified, Emergency Drugs available, Suction available and Patient being monitored Patient Re-evaluated:Patient Re-evaluated prior to inductionOxygen Delivery Method: Circle system utilized Preoxygenation: Pre-oxygenation with 100% oxygen Intubation Type: IV induction LMA: LMA inserted LMA Size: 5.0 Number of attempts: 1 Placement Confirmation: breath sounds checked- equal and bilateral and positive ETCO2 Tube secured with: Tape Dental Injury: Teeth and Oropharynx as per pre-operative assessment

## 2015-01-11 NOTE — Anesthesia Postprocedure Evaluation (Signed)
  Anesthesia Post-op Note  Patient: Steven Bishop  Procedure(s) Performed: Procedure(s) (LRB): OPEN REDUCTION INTERNAL FIXATION (ORIF) RIGHT RADIAL FRACTURE WITH REPAIR AND RECONSTRUCTION (Right)  Patient Location: PACU  Anesthesia Type: GA combined with regional for post-op pain  Level of Consciousness: awake and alert   Airway and Oxygen Therapy: Patient Spontanous Breathing  Post-op Pain: mild  Post-op Assessment: Post-op Vital signs reviewed, Patient's Cardiovascular Status Stable, Respiratory Function Stable, Patent Airway and No signs of Nausea or vomiting  Last Vitals:  Filed Vitals:   01/11/15 2107  BP: 143/69  Pulse: 70  Temp:   Resp: 12    Post-op Vital Signs: stable   Complications: No apparent anesthesia complications

## 2015-01-11 NOTE — Transfer of Care (Signed)
Immediate Anesthesia Transfer of Care Note  Patient: Steven Bishop  Procedure(s) Performed: Procedure(s): OPEN REDUCTION INTERNAL FIXATION (ORIF) RIGHT RADIAL FRACTURE WITH REPAIR AND RECONSTRUCTION (Right)  Patient Location: PACU  Anesthesia Type:GA combined with regional for post-op pain  Level of Consciousness: awake, alert  and oriented  Airway & Oxygen Therapy: Patient connected to nasal cannula oxygen  Post-op Assessment: Report given to RN and Post -op Vital signs reviewed and stable  Post vital signs: Reviewed and stable  Last Vitals:  Filed Vitals:   01/11/15 1800  BP: 126/51  Pulse: 75  Temp:   Resp: 17    Complications: No apparent anesthesia complications

## 2015-01-12 ENCOUNTER — Encounter (HOSPITAL_COMMUNITY): Payer: Self-pay | Admitting: Orthopedic Surgery

## 2015-01-12 NOTE — Op Note (Signed)
NAME:  Steven Bishop, Steven Bishop NO.:  [id number]  MEDICAL RECORD NO.:  [id number]  LOCATION:  [facility]                         FACILITY:  [facility]  PHYSICIAN:  Satira Anis. Gramig, M.D.DATE OF BIRTH:  Dec 04, 1945  DATE OF PROCEDURE: DATE OF DISCHARGE:  01/11/2015                              OPERATIVE REPORT   PREPERATIVE DIAGNOSIS:  Comminuted complex greater than 3-part intra- articular distal radius fracture, right upper extremity.  POSTOPERATIVE DIAGNOSIS:  Comminuted complex greater than 3-part intra- articular distal radius fracture, right upper extremity.  PROCEDURE: 1. Open reduction and internal fixation with DVR plate and screw     construct from [employer], comminuted complex greater than 3-part     distal radius fracture. 2. AP, lateral, and oblique x-rays performed, examined, and     interpreted by myself. 3. Closed treatment ulnar styloid fracture.  SURGEON:  Satira Anis. Amedeo Plenty, M.D.  ASSISTANT:  Avelina Laine, PA-C.  COMPLICATIONS:  None.  DRAINS:  One.  INDICATIONS:  A pleasant male presents with progressive angulatory collapse after distal radius fracture.  I have discussed the risks and benefits, intra-articular nature and plans.  Given his age of 45 years and his independent status, I would like to try to give him back the geometry he has lost.  OPERATION:  The patient was seen by myself and Anesthesia.  Block was placed in the preop holding area.  Time-out was observed in the operative theater. Preoperative antibiotics were given.  He was prepped and draped in usual sterile fashion with Betadine scrub and paint.  An LMA anesthetic was employed by the Anesthesia Department.  Once sterile field was secured, volar radial incision was made dorsal and palmar release to the FCR tendon sheath was accomplished.  Carpal canal contents retracted ulnarly.  Pronator incised in L-shaped fashion. Fracture identified and reduced, held provisionally and then a  DVR standard plate was applied without difficulty.  I was able to achieve adequate height, inclination, and volar tilt and re-created geometry to my satisfaction.  Following this, I then placed him through a full range of motion under live fluoro.  AP, lateral, and oblique x-rays were performed.  Exam interpreted by myself, looked to be excellent. Distal, radial, ulnar joint were was stable.  As the distal, radial, and ulnar joint were stable, radiocarpal and midcarpal joints were quite satisfactory in my opinion.  Following this, I irrigated copiously, closed the pronator with Vicryl, TLS drain placed, skin edge closed with Prolene.  No complications.  These notes have been discussed.  All questions have been encouraged and answered.  Should any problems arise, he will notify us.  We will let him go home tonight.  I will see him back in the office in 12 days.  Standard DVR postop algorithm will be adhered to in my office.     Satira Anis. Amedeo Plenty, M.D.     [facility]  D:  01/11/2015  T:  01/12/2015  Job:  [id number]

## 2017-02-18 DIAGNOSIS — Z1211 Encounter for screening for malignant neoplasm of colon: Secondary | ICD-10-CM | POA: Diagnosis not present

## 2017-06-13 DIAGNOSIS — N529 Male erectile dysfunction, unspecified: Secondary | ICD-10-CM | POA: Diagnosis not present

## 2017-06-13 DIAGNOSIS — N400 Enlarged prostate without lower urinary tract symptoms: Secondary | ICD-10-CM | POA: Diagnosis not present

## 2017-08-13 DIAGNOSIS — Z23 Encounter for immunization: Secondary | ICD-10-CM | POA: Diagnosis not present

## 2017-08-13 DIAGNOSIS — E119 Type 2 diabetes mellitus without complications: Secondary | ICD-10-CM | POA: Diagnosis not present

## 2017-08-13 DIAGNOSIS — Z79899 Other long term (current) drug therapy: Secondary | ICD-10-CM | POA: Diagnosis not present

## 2018-02-13 DIAGNOSIS — Z79899 Other long term (current) drug therapy: Secondary | ICD-10-CM | POA: Diagnosis not present

## 2018-02-13 DIAGNOSIS — E119 Type 2 diabetes mellitus without complications: Secondary | ICD-10-CM | POA: Diagnosis not present

## 2018-05-14 DIAGNOSIS — S0191XA Laceration without foreign body of unspecified part of head, initial encounter: Secondary | ICD-10-CM | POA: Diagnosis not present

## 2018-10-24 DIAGNOSIS — Z79899 Other long term (current) drug therapy: Secondary | ICD-10-CM | POA: Diagnosis not present

## 2018-10-24 DIAGNOSIS — E119 Type 2 diabetes mellitus without complications: Secondary | ICD-10-CM | POA: Diagnosis not present

## 2018-12-02 DIAGNOSIS — R0789 Other chest pain: Secondary | ICD-10-CM | POA: Diagnosis not present

## 2018-12-02 DIAGNOSIS — R55 Syncope and collapse: Secondary | ICD-10-CM | POA: Diagnosis not present

## 2018-12-02 DIAGNOSIS — R0602 Shortness of breath: Secondary | ICD-10-CM | POA: Diagnosis not present

## 2018-12-03 DIAGNOSIS — R0602 Shortness of breath: Secondary | ICD-10-CM | POA: Diagnosis not present

## 2018-12-17 NOTE — Progress Notes (Signed)
Cardiology Office Note:    Date:  12/18/2018   ID:  INDIE MATKOVICH, DOB 1946/04/15, MRN [id number]  PCP:  Enid Skeens., MD  Cardiologist:  Shirlee More, MD   Referring MD: Enid Skeens., MD  ASSESSMENT:    1. Chest pain of uncertain etiology   2. Weakness   3. Type 2 diabetes mellitus with complication, with long-term current use of insulin (HCC)    PLAN:    In order of problems listed above:  1. Atypical symptoms but no high risk individual further evaluation after discussion of modalities to undergo cardiac CTA and if he has severe CAD and high risk markers would benefit from revascularization. 2. His episode raises concerns for hypoglycemia but he does not take insulin did miss a meal and one blood sugar was checked after the episode was elevated.  For further evaluation 2-week ZIO monitor to screen for bradycardia. 3. Stable well-controlled diabetes  Next appointment 6 to 8 weeks  Medication Adjustments/Labs and Tests Ordered: Current medicines are reviewed at length with the patient today.  Concerns regarding medicines are outlined above.  No orders of the defined types were placed in this encounter.  No orders of the defined types were placed in this encounter.    Chief Complaint  Patient presents with  . Chest Pain    History of Present Illness:    Steven Bishop is a 73 y.o. male who is being seen today for the evaluation of chest pain and episode of weakness at the request of [place], Marshall Cork., MD. He is a man with well-controlled type 2 diabetes mellitus he tells me his last A1c was less than 6.  Recently had an episode where in the morning after breakfast was driving the car felt very weak pulled off on the side of the road lasted about 5 minutes some shortness of breath and resolved back in the car had another episode that is worse and during this he had vague chest tightness not severe unrelated to activity of anything better when he pulled the car off the  road and walked he called 911 ambulance squad came out EKG checked his heart rate blood pressure they are all good and did a random glucose of 250% is advised to see his PCP.  He is very vigorous active golfs 3 days a week and has no exertional chest pain exercise intolerance shortness of breath palpitation or syncope he has no known history of congenital rheumatic heart disease no complications of his diabetes.  He has never had symptomatic hypoglycemia in setting they have no Aeroaid medications and ate that morning. Past Medical History:  Diagnosis Date  . Complication of anesthesia    " It takes a while for me to wake up."  . Diabetes mellitus ([place])   . Distal radius fracture, right   . Prostate cancer [facility])     Past Surgical History:  Procedure Laterality Date  . CERVICAL SPINE SURGERY    . ORIF RADIAL FRACTURE Right 01/11/2015   Procedure: OPEN REDUCTION INTERNAL FIXATION (ORIF) RIGHT RADIAL FRACTURE WITH REPAIR AND RECONSTRUCTION;  Surgeon: Roseanne Kaufman, MD;  Location: [place] [facility];  Service: Orthopedics;  Laterality: Right;  . PROSTATECTOMY    . TONSILLECTOMY AND ADENOIDECTOMY      Current Medications: Current Meds  Medication Sig  . Ascorbic Acid (VITAMIN C) 100 MG tablet Take 100 mg by mouth daily.  Marland Kitchen aspirin EC 81 MG tablet Take 81 mg by mouth daily.  Marland Kitchen  Boswellia-Glucosamine-Vit D (OSTEO BI-FLEX ONE PER DAY PO) Take 1 tablet by mouth daily.  . Calcium Carbonate (CALCIUM 600 PO) Take 600 mg by mouth daily.  . cholecalciferol (VITAMIN D) 1000 UNITS tablet Take 1,000 Units by mouth daily.  Marland Kitchen KRILL OIL PO Take 1 capsule by mouth daily.  . LUTEIN PO Take 1 capsule by mouth daily.  . nitroGLYCERIN (NITROSTAT) 0.4 MG SL tablet DISSOLVE 1 TABLET UNDER TONGUE EVERY 5 MIN UP TO 3 TIMES AS NEEDED FOR CHEST PAIN 3TABS/15MIN  . Omega-3 Fatty Acids (FISH OIL PO) Take 1 capsule by mouth daily.  . pioglitazone (ACTOS) 30 MG tablet Take 30 mg by mouth daily.  . vitamin E 100 UNIT capsule Take 100  Units by mouth daily.     Allergies:   Patient has no known allergies.   Social History   Socioeconomic History  . Marital status: Married    Spouse name: Not on file  . Number of children: Not on file  . Years of education: Not on file  . Highest education level: Not on file  Occupational History  . Not on file  Social Needs  . Financial resource strain: Not on file  . Food insecurity    Worry: Not on file    Inability: Not on file  . Transportation needs    Medical: Not on file    Non-medical: Not on file  Tobacco Use  . Smoking status: Former Smoker    Packs/day: 1.50    Years: 20.00    Pack years: 30.00    Types: Cigarettes  . Smokeless tobacco: Never Used  Substance and Sexual Activity  . Alcohol use: No  . Drug use: No  . Sexual activity: Not on file  Lifestyle  . Physical activity    Days per week: Not on file    Minutes per session: Not on file  . Stress: Not on file  Relationships  . Social Herbalist on phone: Not on file    Gets together: Not on file    Attends religious service: Not on file    Active member of club or organization: Not on file    Attends meetings of clubs or organizations: Not on file    Relationship status: Not on file  Other Topics Concern  . Not on file  Social History Narrative  . Not on file     Family History: The patient's family history includes Heart Problems in his mother and sister; Kidney failure in his father; Macular degeneration in his mother.  ROS:   Review of Systems  Constitution: Negative.  HENT: Negative.   Eyes: Negative.   Cardiovascular: Positive for chest pain.  Respiratory: Positive for shortness of breath.   Endocrine: Negative.   Hematologic/Lymphatic: Negative.   Skin: Negative.   Gastrointestinal: Negative.   Genitourinary: Negative.   Neurological: Positive for paresthesias and weakness.  Psychiatric/Behavioral: Negative.   Allergic/Immunologic: Negative.    Please see the  history of present illness.     All other systems reviewed and are negative.  EKGs/Labs/Other Studies Reviewed:    The following studies were reviewed today:   EKG:  EKG is  ordered today.  The ekg ordered today is personally reviewed and demonstrates sinus rhythm normal  Recent Labs: Labs from his PCP office recent CMP CBC normal cholesterol 140 triglycerides 61 HDL 46 LDL 82  Physical Exam:    VS:  BP 128/70 (BP Location: Left Arm, Patient Position: Sitting, Cuff  Size: Large)   Pulse (!) 59   Ht 5\' 11"  (1.803 m)   Wt 207 lb 12.8 oz (94.3 kg)   SpO2 98%   BMI 28.98 kg/m     Wt Readings from Last 3 Encounters:  12/18/18 207 lb 12.8 oz (94.3 kg)  01/11/15 205 lb (93 kg)  08/22/14 204 lb (92.5 kg)     GEN:  Well nourished, well developed in no acute distress HEENT: Normal NECK: No JVD; No carotid bruits LYMPHATICS: No lymphadenopathy CARDIAC: RRR, no murmurs, rubs, gallops RESPIRATORY:  Clear to auscultation without rales, wheezing or rhonchi  ABDOMEN: Soft, non-tender, non-distended MUSCULOSKELETAL:  No edema; No deformity  SKIN: Warm and dry NEUROLOGIC:  Alert and oriented x 3 PSYCHIATRIC:  Normal affect     Signed, Shirlee More, MD  12/18/2018 3:07 PM     [facility]

## 2018-12-18 ENCOUNTER — Other Ambulatory Visit: Payer: Self-pay

## 2018-12-18 ENCOUNTER — Ambulatory Visit (INDEPENDENT_AMBULATORY_CARE_PROVIDER_SITE_OTHER): Payer: PPO | Admitting: Cardiology

## 2018-12-18 ENCOUNTER — Encounter: Payer: Self-pay | Admitting: Cardiology

## 2018-12-18 ENCOUNTER — Encounter: Payer: Self-pay | Admitting: *Deleted

## 2018-12-18 VITALS — BP 128/70 | HR 59 | Ht 71.0 in | Wt 207.8 lb

## 2018-12-18 DIAGNOSIS — E118 Type 2 diabetes mellitus with unspecified complications: Secondary | ICD-10-CM

## 2018-12-18 DIAGNOSIS — R0789 Other chest pain: Secondary | ICD-10-CM | POA: Diagnosis not present

## 2018-12-18 DIAGNOSIS — Z794 Long term (current) use of insulin: Secondary | ICD-10-CM | POA: Diagnosis not present

## 2018-12-18 DIAGNOSIS — R531 Weakness: Secondary | ICD-10-CM

## 2018-12-18 DIAGNOSIS — Z01812 Encounter for preprocedural laboratory examination: Secondary | ICD-10-CM

## 2018-12-18 DIAGNOSIS — R002 Palpitations: Secondary | ICD-10-CM

## 2018-12-18 DIAGNOSIS — R079 Chest pain, unspecified: Secondary | ICD-10-CM

## 2018-12-18 HISTORY — DX: Chest pain, unspecified: R07.9

## 2018-12-18 MED ORDER — METOPROLOL TARTRATE 50 MG PO TABS: 100.0000 mg | ORAL_TABLET | Freq: Once | ORAL | 0 refills | Status: DC

## 2018-12-18 NOTE — Patient Instructions (Signed)
Medication Instructions:  Your physician recommends that you continue on your current medications as directed. Please refer to the Current Medication list given to you today.  If you need a refill on your cardiac medications before your next appointment, please call your pharmacy.   Lab work: Your physician recommends that you return for lab work within 3-7 days before your cardiac CTA: BMP.   If you have labs (blood work) drawn today and your tests are completely normal, you will receive your results only by: Marland Kitchen MyChart Message (if you have MyChart) OR . A paper copy in the mail If you have any lab test that is abnormal or we need to change your treatment, we will call you to review the results.  Testing/Procedures: You had an EKG today.   Your physician has recommended that you wear a ZIO monitor. ZIO monitors are medical devices that record the heart's electrical activity. Doctors most often use these monitors to diagnose arrhythmias. Arrhythmias are problems with the speed or rhythm of the heartbeat. The monitor is a small, portable device. You can wear one while you do your normal daily activities. This is usually used to diagnose what is causing palpitations/syncope (passing out). Wear for 14 days.   Your physician has requested that you have cardiac CT. Cardiac computed tomography (CT) is a painless test that uses an x-ray machine to take clear, detailed pictures of your heart. For further information please visit HugeFiesta[employer]tn. Please follow instruction sheet as given.   [facility] [address] [place], [place] [address] [phone]  Please arrive at the [facility] main entrance of [facility] 30-45 minutes prior to test start time. Proceed to the [facility] (first floor) to check-in and test prep.  Please follow these instructions carefully (unless otherwise directed):  Hold all erectile dysfunction medications at least 48  hours prior to test.  On the Night Before the Test: . Be sure to Drink plenty of water. . Do not consume any caffeinated/decaffeinated beverages or chocolate 12 hours prior to your test. . Do not take any antihistamines 12 hours prior to your test. .  On the Day of the Test: . Drink plenty of water. Do not drink any water within one hour of the test. . Do not eat any food 4 hours prior to the test. . You may take your regular medications prior to the test.  . Take metoprolol (Lopressor) two hours prior to test.                 -If HR is less than 55 BPM- No Beta Blocker                -IF HR is greater than 55 BPM and patient is less than or equal to 40 yrs old Lopressor 100mg  x1.       After the Test: . Drink plenty of water. . After receiving IV contrast, you may experience a mild flushed feeling. This is normal. . On occasion, you may experience a mild rash up to 24 hours after the test. This is not dangerous. If this occurs, you can take Benadryl 25 mg and increase your fluid intake. . If you experience trouble breathing, this can be serious. If it is severe call 911 IMMEDIATELY. If it is mild, please call our office.    Please contact the cardiac imaging nurse navigator should you have any questions/concerns Marchia Bond, RN Navigator Cardiac Imaging [facility] [phone] Office  [phone] Cell    Follow-Up: At [facility], you and your health needs are our priority.  As part of our continuing mission to provide you with exceptional heart care, we have created designated Provider Care Teams.  These Care Teams include your primary Cardiologist (physician) and Advanced Practice Providers (APPs -  Physician Assistants and Nurse Practitioners) who all work together to provide you with the care you need, when you need it. You will need a follow up appointment in 8 weeks.     Cardiac CT Angiogram  A cardiac CT angiogram is a procedure to look  at the heart and the area around the heart. It may be done to help find the cause of chest pains or other symptoms of heart disease. During this procedure, a large X-ray machine, called a CT scanner, takes detailed pictures of the heart and the surrounding area after a dye (contrast material) has been injected into blood vessels in the area. The procedure is also sometimes called a coronary CT angiogram, coronary artery scanning, or CTA. A cardiac CT angiogram allows the health care provider to see how well blood is flowing to and from the heart. The health care provider will be able to see if there are any problems, such as:  Blockage or narrowing of the coronary arteries in the heart.  Fluid around the heart.  Signs of weakness or disease in the muscles, valves, and tissues of the heart. Tell a health care provider about:  Any allergies you have. This is especially important if you have had a previous allergic reaction to contrast dye.  All medicines you are taking, including vitamins, herbs, eye drops, creams, and over-the-counter medicines.  Any blood disorders you have.  Any surgeries you have had.  Any medical conditions you have.  Whether you are pregnant or may be pregnant.  Any anxiety disorders, chronic pain, or other conditions you have that may increase your stress or prevent you from lying still. What are the risks? Generally, this is a safe procedure. However, problems may occur, including:  Bleeding.  Infection.  Allergic reactions to medicines or dyes.  Damage to other structures or organs.  Kidney damage from the dye or contrast that is used.  Increased risk of cancer from radiation exposure. This risk is low. Talk with your health care provider about: ? The risks and benefits of testing. ? How you can receive the lowest dose of radiation. What happens before the procedure?  Wear comfortable clothing and remove any jewelry, glasses, dentures, and hearing  aids.  Follow instructions from your health care provider about eating and drinking. This may include: ? For 12 hours before the test - avoid caffeine. This includes tea, coffee, soda, energy drinks, and diet pills. Drink plenty of water or other fluids that do not have caffeine in them. Being well-hydrated can prevent complications. ? For 4-6 hours before the test - stop eating and drinking. The contrast dye can cause nausea, but this is less likely if your stomach is empty.  Ask your health care provider about changing or stopping your regular medicines. This is especially important if you are taking diabetes medicines, blood thinners, or medicines to treat erectile dysfunction. What happens during the procedure?  Hair on your chest may need to be removed so that small sticky patches called electrodes can be placed on your chest. These will transmit information that helps to monitor your heart during the test.  An IV tube  will be inserted into one of your veins.  You might be given a medicine to control your heart rate during the test. This will help to ensure that good images are obtained.  You will be asked to lie on an exam table. This table will slide in and out of the CT machine during the procedure.  Contrast dye will be injected into the IV tube. You might feel warm, or you may get a metallic taste in your mouth.  You will be given a medicine (nitroglycerin) to relax (dilate) the arteries in your heart.  The table that you are lying on will move into the CT machine tunnel for the scan.  The person running the machine will give you instructions while the scans are being done. You may be asked to: ? Keep your arms above your head. ? Hold your breath. ? Stay very still, even if the table is moving.  When the scanning is complete, you will be moved out of the machine.  The IV tube will be removed. The procedure may vary among health care providers and hospitals. What happens  after the procedure?  You might feel warm, or you may get a metallic taste in your mouth from the contrast dye.  You may have a headache from the nitroglycerin.  After the procedure, drink water or other fluids to wash (flush) the contrast material out of your body.  Contact a health care provider if you have any symptoms of allergy to the contrast. These symptoms include: ? Shortness of breath. ? Rash or hives. ? A racing heartbeat.  Most people can return to their normal activities right after the procedure. Ask your health care provider what activities are safe for you.  It is up to you to get the results of your procedure. Ask your health care provider, or the department that is doing the procedure, when your results will be ready. Summary  A cardiac CT angiogram is a procedure to look at the heart and the area around the heart. It may be done to help find the cause of chest pains or other symptoms of heart disease.  During this procedure, a large X-ray machine, called a CT scanner, takes detailed pictures of the heart and the surrounding area after a dye (contrast material) has been injected into blood vessels in the area.  Ask your health care provider about changing or stopping your regular medicines before the procedure. This is especially important if you are taking diabetes medicines, blood thinners, or medicines to treat erectile dysfunction.  After the procedure, drink water or other fluids to wash (flush) the contrast material out of your body. This information is not intended to replace advice given to you by your health care provider. Make sure you discuss any questions you have with your health care provider. Document Released: 03/15/2008 Document Revised: 03/15/2017 Document Reviewed: 02/20/2016 Elsevier Patient Education  2020 [employer].

## 2018-12-25 ENCOUNTER — Ambulatory Visit (INDEPENDENT_AMBULATORY_CARE_PROVIDER_SITE_OTHER): Payer: PPO

## 2018-12-25 DIAGNOSIS — R0789 Other chest pain: Secondary | ICD-10-CM

## 2018-12-25 DIAGNOSIS — R002 Palpitations: Secondary | ICD-10-CM | POA: Diagnosis not present

## 2018-12-30 ENCOUNTER — Telehealth: Payer: Self-pay | Admitting: Cardiology

## 2018-12-30 NOTE — Telephone Encounter (Signed)
Patient called to let us know his heart monitor will be coming off 9/25 so his cardiac ct can be scheduled for after that. Please advise.

## 2019-01-05 NOTE — Telephone Encounter (Signed)
Patient has been scheduled for his cardiac CTA on 01/13/2019 at 8:30 am.

## 2019-01-09 ENCOUNTER — Telehealth (HOSPITAL_COMMUNITY): Payer: Self-pay | Admitting: Emergency Medicine

## 2019-01-09 DIAGNOSIS — Z01812 Encounter for preprocedural laboratory examination: Secondary | ICD-10-CM | POA: Diagnosis not present

## 2019-01-09 DIAGNOSIS — R0789 Other chest pain: Secondary | ICD-10-CM | POA: Diagnosis not present

## 2019-01-09 NOTE — Telephone Encounter (Signed)
Reaching out to patient to offer assistance regarding upcoming cardiac imaging study; pt verbalizes understanding of appt date/time, parking situation and where to check in, pre-test NPO status and medications ordered, and verified current allergies; name and call back number provided for further questions should they arise Steven Bond RN Navigator Cardiac Imaging [place] [facility] [phone] office [phone] cell  Pt confirmed that HR between 55-75 bpm per smart watch

## 2019-01-10 LAB — BASIC METABOLIC PANEL
BUN/Creatinine Ratio: 18 (ref 10–24)
BUN: 19 mg/dL (ref 8–27)
CO2: 24 mmol/L (ref 20–29)
Calcium: 8.9 mg/dL (ref 8.6–10.2)
Chloride: 104 mmol/L (ref 96–106)
Creatinine, Ser: 1.06 mg/dL (ref 0.76–1.27)
GFR calc Af Amer: 81 mL/min/{1.73_m2} (ref >59)
GFR calc non Af Amer: 70 mL/min/{1.73_m2} (ref >59)
Glucose: 94 mg/dL (ref 65–99)
Potassium: 4.7 mmol/L (ref 3.5–5.2)
Sodium: 139 mmol/L (ref 134–144)

## 2019-01-12 ENCOUNTER — Ambulatory Visit (HOSPITAL_COMMUNITY): Payer: PPO

## 2019-01-13 ENCOUNTER — Ambulatory Visit (HOSPITAL_COMMUNITY)
Admission: RE | Admit: 2019-01-13 | Discharge: 2019-01-13 | Disposition: A | Discharge status: Home / Self Care | Payer: PPO | Source: Ambulatory Visit | Attending: Cardiology | Admitting: Cardiology

## 2019-01-13 ENCOUNTER — Encounter (HOSPITAL_COMMUNITY): Payer: Self-pay

## 2019-01-13 ENCOUNTER — Other Ambulatory Visit: Payer: Self-pay

## 2019-01-13 DIAGNOSIS — I2510 Atherosclerotic heart disease of native coronary artery without angina pectoris: Secondary | ICD-10-CM | POA: Insufficient documentation

## 2019-01-13 DIAGNOSIS — Q245 Malformation of coronary vessels: Secondary | ICD-10-CM | POA: Diagnosis not present

## 2019-01-13 DIAGNOSIS — R0789 Other chest pain: Secondary | ICD-10-CM

## 2019-01-13 DIAGNOSIS — R079 Chest pain, unspecified: Secondary | ICD-10-CM | POA: Insufficient documentation

## 2019-01-13 MED ORDER — NITROGLYCERIN 0.4 MG SL SUBL
SUBLINGUAL_TABLET | SUBLINGUAL | Status: AC
  Filled 2019-01-13: qty 2

## 2019-01-13 MED ORDER — NITROGLYCERIN 0.4 MG SL SUBL
0.8000 mg | SUBLINGUAL_TABLET | Freq: Once | SUBLINGUAL | Status: AC
  Administered 2019-01-13: 09:00:00 0.8 mg via SUBLINGUAL
  Filled 2019-01-13: qty 25

## 2019-01-13 MED ORDER — IOHEXOL 350 MG/ML SOLN
80.0000 mL | Freq: Once | INTRAVENOUS | Status: AC | PRN
  Administered 2019-01-13: 80 mL via INTRAVENOUS

## 2019-01-13 NOTE — Progress Notes (Signed)
Patient tolerated CT without incident. Drank water and ate peanut butter crackers. Ambulated steady gait to exit.

## 2019-01-15 DIAGNOSIS — R002 Palpitations: Secondary | ICD-10-CM | POA: Diagnosis not present

## 2019-02-04 NOTE — Progress Notes (Signed)
Cardiology Office Note:    Date:  02/05/2019   ID:  Steven Bishop, DOB 04-Mar-1946, MRN [id number]  PCP:  Enid Skeens., MD  Cardiologist:  Shirlee More, MD    Referring MD: Enid Skeens., MD    ASSESSMENT:    1. Mild CAD   2. Coronary-myocardial bridge    PLAN:    In order of problems listed above:  1. Initiate medical therapy aspirin nitroglycerin as needed and low intensity statin follow-up lipids with his PCP and a goal of LDL less than 55-70.  In the office in 1 year or sooner if he is having frequent angina.  He understands that his coronary myocardial bridge rarely causes symptoms and does not require any interventional cardiology procedure   Next appointment: 1 year   Medication Adjustments/Labs and Tests Ordered: Current medicines are reviewed at length with the patient today.  Concerns regarding medicines are outlined above.  No orders of the defined types were placed in this encounter.  No orders of the defined types were placed in this encounter.   Chief Complaint  Patient presents with  . Follow-up    After cardiac testing    History of Present Illness:    Steven Bishop is a 73 y.o. male with a hx of chest pain last seen 12/18/2018.Marland Kitchen Compliance with diet, lifestyle and medications: Yes  When opportunity sit down and review his testing.  He understands he has mild to moderate CAD nonobstructive in retrospect his episode was anginal discomfort.  I advised medical therapy with aspirin initiation of lipid-lowering with pravastatin and follow-up lipids liver function in his PCP office at this time I do not think he needs antianginal or antihypertensive therapy he has a prescription for nitroglycerin I advised full activity strict control of diabetes healthy lifestyle stress reduction and in the absence of frequent anginal see back in the office in 1 year.  He understands the myocardial coronary bridge is a curiosity.  Is extremely rare to cause symptoms in  the absence of severe left ventricular hypertrophy or cardiomyopathy.  Has had no further symptoms he is vigorous active golfs several times a week has had no chest pain shortness of breath palpitation or syncope.  He is reassured to know that he has no significant arrhythmia.  Cardiac CTA 01/15/2019: IMPRESSION: 1. 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 2. Coronary calcium score of 141. This was 72 percentile for age and sex matched control. 3. Normal coronary origin with right dominance. 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  Study Highlights An event monitor was performed for 14 days beginning 12/25/2018 in order to assess syncope. The rhythm throughout is sinus with minimum average and maximum heart rates of 45, 64 and 107 bpm.  The minimum rate is sinus bradycardia. There were no pauses of 3 seconds or greater and no episodes of AV node or sinus node block. Ventricular ectopy was rare with isolated PVCs couplets and bigeminy 7.7 seconds trigeminy 6.3 seconds. Supraventricular arrhythmia was rare with APCs.  There were 13 brief runs of atrial premature contractions the longest 15 complexes at a rate of 109 bpm, atrial tachycardia.  The fastest 4 beats at a rate of 179 bpm.  There are no episodes of atrial fibrillation or flutter. There were 2 diary events 1 with palpitation associated with infrequent PVC There were 5 triggered events 1 associated with infrequent PVC  Conclusion unremarkable 14-day ZIO monitor to assess syncope, no significant bradycardia seen.  There is rare supraventricular and ventricular ectopy present and the triggered and diary events are predominantly unassociated with PVCs    Past Medical History:  Diagnosis Date  . Complication of anesthesia    " It takes a while for me to wake up."  . Diabetes mellitus ([place])   . Distal radius fracture, right   . Prostate cancer [facility])     Past Surgical History:  Procedure Laterality Date  . CERVICAL SPINE SURGERY     . ORIF RADIAL FRACTURE Right 01/11/2015   Procedure: OPEN REDUCTION INTERNAL FIXATION (ORIF) RIGHT RADIAL FRACTURE WITH REPAIR AND RECONSTRUCTION;  Surgeon: Roseanne Kaufman, MD;  Location: [place];  Service: Orthopedics;  Laterality: Right;  . PROSTATECTOMY    . TONSILLECTOMY AND ADENOIDECTOMY      Current Medications: Current Meds  Medication Sig  . albuterol (VENTOLIN HFA) 108 (90 Base) MCG/ACT inhaler SMARTSIG:2 Puff(s) By Mouth Every 6 Hours PRN  . Ascorbic Acid (VITAMIN C) 100 MG tablet Take 100 mg by mouth daily.  Marland Kitchen aspirin EC 81 MG tablet Take 81 mg by mouth daily.  . [place]-Glucosamine-Vit D (OSTEO BI-FLEX ONE PER DAY PO) Take 1 tablet by mouth daily.  . Calcium Carbonate (CALCIUM 600 PO) Take 600 mg by mouth daily.  . cholecalciferol (VITAMIN D) 1000 UNITS tablet Take 1,000 Units by mouth daily.  Marland Kitchen KRILL OIL PO Take 1 capsule by mouth daily.  . LUTEIN PO Take 1 capsule by mouth daily.  . nitroGLYCERIN (NITROSTAT) 0.4 MG SL tablet DISSOLVE 1 TABLET UNDER TONGUE EVERY 5 MIN UP TO 3 TIMES AS NEEDED FOR CHEST PAIN 3TABS/15MIN  . Omega-3 Fatty Acids (FISH OIL PO) Take 1 capsule by mouth daily.  . pioglitazone (ACTOS) 30 MG tablet Take 30 mg by mouth daily.  . vitamin E 100 UNIT capsule Take 100 Units by mouth daily.     Allergies:   Patient has no known allergies.   Social History   Socioeconomic History  . Marital status: Married    Spouse name: Not on file  . Number of children: Not on file  . Years of education: Not on file  . Highest education level: Not on file  Occupational History  . Not on file  Social Needs  . Financial resource strain: Not on file  . Food insecurity    Worry: Not on file    Inability: Not on file  . Transportation needs    Medical: Not on file    Non-medical: Not on file  Tobacco Use  . Smoking status: Former Smoker    Packs/day: 1.50    Years: 20.00    Pack years: 30.00    Types: Cigarettes  . Smokeless tobacco: Never Used  Substance  and Sexual Activity  . Alcohol use: No  . Drug use: No  . Sexual activity: Not on file  Lifestyle  . Physical activity    Days per week: Not on file    Minutes per session: Not on file  . Stress: Not on file  Relationships  . Social Herbalist on phone: Not on file    Gets together: Not on file    Attends religious service: Not on file    Active member of club or organization: Not on file    Attends meetings of clubs or organizations: Not on file    Relationship status: Not on file  Other Topics Concern  . Not on file  Social History Narrative  . Not on  file     Family History: The patient's family history includes Heart Problems in his mother and sister; Kidney failure in his father; Macular degeneration in his mother. ROS:   Please see the history of present illness.    All other systems reviewed and are negative.  EKGs/Labs/Other Studies Reviewed:    The following studies were reviewed today:   Recent Labs: 01/09/2019: BUN 19; Creatinine, Ser 1.06; Potassium 4.7; Sodium 139  Labs from his PCP office recent CMP CBC normal cholesterol 140 triglycerides 61 HDL 46 LDL 82  Physical Exam:    VS:  BP 116/70 (BP Location: Right Arm, Patient Position: Sitting, Cuff Size: Large)   Pulse (!) 56   Ht 5\' 11"  (1.803 m)   Wt 209 lb 6.4 oz (95 kg)   SpO2 98%   BMI 29.21 kg/m     Wt Readings from Last 3 Encounters:  02/05/19 209 lb 6.4 oz (95 kg)  12/18/18 207 lb 12.8 oz (94.3 kg)  01/11/15 205 lb (93 kg)     GEN:  Well nourished, well developed in no acute distress HEENT: Normal NECK: No JVD; No carotid bruits LYMPHATICS: No lymphadenopathy CARDIAC: RRR, no murmurs, rubs, gallops RESPIRATORY:  Clear to auscultation without rales, wheezing or rhonchi  ABDOMEN: Soft, non-tender, non-distended MUSCULOSKELETAL:  No edema; No deformity  SKIN: Warm and dry NEUROLOGIC:  Alert and oriented x 3 PSYCHIATRIC:  Normal affect    Signed, Shirlee More, MD  02/05/2019  10:30 AM    [place]

## 2019-02-05 ENCOUNTER — Other Ambulatory Visit: Payer: Self-pay

## 2019-02-05 ENCOUNTER — Ambulatory Visit (INDEPENDENT_AMBULATORY_CARE_PROVIDER_SITE_OTHER): Payer: PPO | Admitting: Cardiology

## 2019-02-05 VITALS — BP 116/70 | HR 56 | Ht 71.0 in | Wt 209.4 lb

## 2019-02-05 DIAGNOSIS — I2510 Atherosclerotic heart disease of native coronary artery without angina pectoris: Secondary | ICD-10-CM | POA: Diagnosis not present

## 2019-02-05 DIAGNOSIS — Q245 Malformation of coronary vessels: Secondary | ICD-10-CM | POA: Diagnosis not present

## 2019-02-05 MED ORDER — PRAVASTATIN SODIUM 40 MG PO TABS: 40.0000 mg | ORAL_TABLET | Freq: Every evening | ORAL | 3 refills | Status: DC

## 2019-02-05 NOTE — Patient Instructions (Signed)
Medication Instructions:  Your physician has recommended you make the following change in your medication:   START pravastatin (pravachol) 40 mg: Take 1 tablet daily with your evening meal  *If you need a refill on your cardiac medications before your next appointment, please call your pharmacy*  Lab Work: None  If you have labs (blood work) drawn today and your tests are completely normal, you will receive your results only by: Marland Kitchen MyChart Message (if you have MyChart) OR . A paper copy in the mail If you have any lab test that is abnormal or we need to change your treatment, we will call you to review the results.  Testing/Procedures: None  Follow-Up: At [facility], you and your health needs are our priority.  As part of our continuing mission to provide you with exceptional heart care, we have created designated Provider Care Teams.  These Care Teams include your primary Cardiologist (physician) and Advanced Practice Providers (APPs -  Physician Assistants and Nurse Practitioners) who all work together to provide you with the care you need, when you need it.  Your next appointment:   12 months  The format for your next appointment:   In Person  Provider:   Shirlee More, MD   Pravastatin tablets What is this medicine? PRAVASTATIN (PRA va stat in) is known as a HMG-CoA reductase inhibitor or 'statin'. It lowers the level of cholesterol and triglycerides in the blood. This drug may also reduce the risk of heart attack, stroke, or other health problems in patients with risk factors for heart disease. Diet and lifestyle changes are often used with this drug. This medicine may be used for other purposes; ask your health care provider or pharmacist if you have questions. COMMON BRAND NAME(S): Pravachol What should I tell my health care provider before I take this medicine? They need to know if you have any of these conditions:  diabetes  if you often drink alcohol  history of  stroke  kidney disease  liver disease  muscle aches or weakness  thyroid disease  an unusual or allergic reaction to pravastatin, other medicines, foods, dyes, or preservatives  pregnant or trying to get pregnant  breast-feeding How should I use this medicine? Take pravastatin tablets by mouth. Swallow the tablets with a drink of water. Pravastatin can be taken at anytime of the day, with or without food. Follow the directions on the prescription label. Take your doses at regular intervals. Do not take your medicine more often than directed. Talk to your pediatrician regarding the use of this medicine in children. Special care may be needed. Pravastatin has been used in children as young as 29 years of age. Overdosage: If you think you have taken too much of this medicine contact a poison control center or emergency room at once. NOTE: This medicine is only for you. Do not share this medicine with others. What if I miss a dose? If you miss a dose, take it as soon as you can. If it is almost time for your next dose, take only that dose. Do not take double or extra doses. What may interact with this medicine? This medicine may interact with the following medications:  colchicine  cyclosporine  other medicines for high cholesterol  some antibiotics like azithromycin, clarithromycin, erythromycin, and telithromycin This list may not describe all possible interactions. Give your health care provider a list of all the medicines, herbs, non-prescription drugs, or dietary supplements you use. Also tell them if you smoke,  drink alcohol, or use illegal drugs. Some items may interact with your medicine. What should I watch for while using this medicine? Visit your doctor or health care professional for regular check-ups. You may need regular tests to make sure your liver is working properly. Your health care professional may tell you to stop taking this medicine if you develop muscle problems.  If your muscle problems do not go away after stopping this medicine, contact your health care professional. Do not become pregnant while taking this medicine. Women should inform their health care professional if they wish to become pregnant or think they might be pregnant. There is a potential for serious side effects to an unborn child. Talk to your health care professional or pharmacist for more information. Do not breast-feed an infant while taking this medicine. This medicine may affect blood sugar levels. If you have diabetes, check with your doctor or health care professional before you change your diet or the dose of your diabetic medicine. If you are going to need surgery or other procedure, tell your doctor that you are using this medicine. This drug is only part of a total heart-health program. Your doctor or a dietician can suggest a low-cholesterol and low-fat diet to help. Avoid alcohol and smoking, and keep a proper exercise schedule. This medicine may cause a decrease in Co-Enzyme Q-10. You should make sure that you get enough Co-Enzyme Q-10 while you are taking this medicine. Discuss the foods you eat and the vitamins you take with your health care professional. What side effects may I notice from receiving this medicine? Side effects that you should report to your doctor or health care professional as soon as possible:  allergic reactions like skin rash, itching or hives, swelling of the face, lips, or tongue  dark urine  fever  muscle pain, cramps, or weakness  redness, blistering, peeling or loosening of the skin, including inside the mouth  trouble passing urine or change in the amount of urine  unusually weak or tired  yellowing of the eyes or skin Side effects that usually do not require medical attention (report to your doctor or health care professional if they continue or are bothersome):  gas  headache  heartburn  indigestion  stomach pain This list may  not describe all possible side effects. Call your doctor for medical advice about side effects. You may report side effects to FDA at 1-800-FDA-1088. Where should I keep my medicine? Keep out of the reach of children. Store at room temperature between 15 to 30 degrees C (59 to 86 degrees F). Protect from light. Keep container tightly closed. Throw away any unused medicine after the expiration date. NOTE: This sheet is a summary. It may not cover all possible information. If you have questions about this medicine, talk to your doctor, pharmacist, or health care provider.  2020 Elsevier/Gold Standard (2016-12-04 12:37:09)

## 2019-02-05 NOTE — Addendum Note (Signed)
Addended by: Austin Miles on: 02/05/2019 10:37 AM   Modules accepted: Orders

## 2019-03-02 DIAGNOSIS — J0110 Acute frontal sinusitis, unspecified: Secondary | ICD-10-CM | POA: Diagnosis not present

## 2019-03-05 ENCOUNTER — Other Ambulatory Visit: Payer: Self-pay | Admitting: Cardiology

## 2019-03-17 DIAGNOSIS — E119 Type 2 diabetes mellitus without complications: Secondary | ICD-10-CM | POA: Diagnosis not present

## 2019-03-26 DIAGNOSIS — E119 Type 2 diabetes mellitus without complications: Secondary | ICD-10-CM | POA: Diagnosis not present

## 2019-05-26 DIAGNOSIS — Z79899 Other long term (current) drug therapy: Secondary | ICD-10-CM | POA: Diagnosis not present

## 2019-05-26 DIAGNOSIS — E119 Type 2 diabetes mellitus without complications: Secondary | ICD-10-CM | POA: Diagnosis not present

## 2019-10-12 DIAGNOSIS — E7801 Familial hypercholesterolemia: Secondary | ICD-10-CM | POA: Diagnosis not present

## 2019-10-12 DIAGNOSIS — Z79899 Other long term (current) drug therapy: Secondary | ICD-10-CM | POA: Diagnosis not present

## 2019-10-12 DIAGNOSIS — E119 Type 2 diabetes mellitus without complications: Secondary | ICD-10-CM | POA: Diagnosis not present

## 2019-11-18 ENCOUNTER — Other Ambulatory Visit: Payer: Self-pay | Admitting: Cardiology

## 2019-12-07 ENCOUNTER — Other Ambulatory Visit: Payer: Self-pay

## 2019-12-17 ENCOUNTER — Other Ambulatory Visit: Payer: Self-pay | Admitting: Cardiology

## 2019-12-17 MED ORDER — PRAVASTATIN SODIUM 40 MG PO TABS: ORAL_TABLET | ORAL | 3 refills | Status: DC

## 2019-12-17 NOTE — Telephone Encounter (Signed)
° ° ° °*  STAT* If patient is at the pharmacy, call can be transferred to refill team.   1. Which medications need to be refilled? (please list name of each medication and dose if known) pravastatin (PRAVACHOL) 40 MG tablet  2. Which pharmacy/location (including street and city if local pharmacy) is medication to be sent to? [address] [place], [place] [address]  3. Do they need a 30 day or 90 day supply? 30 days

## 2019-12-17 NOTE — Telephone Encounter (Signed)
Refills sent in per request 

## 2020-02-17 ENCOUNTER — Ambulatory Visit: Payer: PPO | Admitting: Cardiology

## 2020-03-02 DIAGNOSIS — C61 Malignant neoplasm of prostate: Secondary | ICD-10-CM | POA: Insufficient documentation

## 2020-03-02 DIAGNOSIS — T8859XA Other complications of anesthesia, initial encounter: Secondary | ICD-10-CM | POA: Insufficient documentation

## 2020-03-02 DIAGNOSIS — E119 Type 2 diabetes mellitus without complications: Secondary | ICD-10-CM | POA: Insufficient documentation

## 2020-03-02 DIAGNOSIS — S52501A Unspecified fracture of the lower end of right radius, initial encounter for closed fracture: Secondary | ICD-10-CM | POA: Insufficient documentation

## 2020-03-09 ENCOUNTER — Ambulatory Visit: Payer: PPO | Admitting: Cardiology

## 2020-03-09 ENCOUNTER — Encounter: Payer: Self-pay | Admitting: Cardiology

## 2020-03-09 ENCOUNTER — Other Ambulatory Visit: Payer: Self-pay

## 2020-03-09 VITALS — BP 118/68 | HR 63 | Ht 71.0 in | Wt 216.6 lb

## 2020-03-09 DIAGNOSIS — Z794 Long term (current) use of insulin: Secondary | ICD-10-CM | POA: Diagnosis not present

## 2020-03-09 DIAGNOSIS — Q245 Malformation of coronary vessels: Secondary | ICD-10-CM

## 2020-03-09 DIAGNOSIS — E118 Type 2 diabetes mellitus with unspecified complications: Secondary | ICD-10-CM | POA: Diagnosis not present

## 2020-03-09 DIAGNOSIS — E785 Hyperlipidemia, unspecified: Secondary | ICD-10-CM

## 2020-03-09 DIAGNOSIS — R079 Chest pain, unspecified: Secondary | ICD-10-CM

## 2020-03-09 DIAGNOSIS — R931 Abnormal findings on diagnostic imaging of heart and coronary circulation: Secondary | ICD-10-CM

## 2020-03-09 NOTE — Patient Instructions (Signed)

## 2020-03-09 NOTE — Progress Notes (Signed)
Cardiology Office Note:    Date:  03/09/2020   ID:  Steven Bishop, DOB 10/03/1945, MRN [id number]  PCP:  Enid Skeens., MD  Cardiologist:  Shirlee More, MD    Referring MD: Enid Skeens., MD    ASSESSMENT:    1. Coronary-myocardial bridge   2. Type 2 diabetes mellitus with complication, with long-term current use of insulin ([place])   3. Dyslipidemia   4. Chest pain, unspecified type   5. High coronary artery calcium score    PLAN:    In order of problems listed above:  1. An opportunity to sit down draw a diagram of the coronaries explained that it is uncommon for coronary myocardial bridge to restrict use cardiac blood flow as is a diastolic phenomenon.  He is not having typical angina and I would not advise consideration of intervention usually surgical to unroofed the coronary vessel.  Continue his current medical therapy with aspirin and statin. 2. Check CMP lipid profile 3. Continue current treatment of additional agent is needed GLP-1 agonist or SGLT2 inhibitors are very effective from a cardiovascular viewpoint 4. Continue aspirin and statin with elevated calcium score   Next appointment: 1 year   Medication Adjustments/Labs and Tests Ordered: Current medicines are reviewed at length with the patient today.  Concerns regarding medicines are outlined above.  Orders Placed This Encounter  Procedures  . Comprehensive metabolic panel  . Lipid panel  . EKG 12-Lead   No orders of the defined types were placed in this encounter.   Chief Complaint  Patient presents with  . Follow-up    Follow-up he has a myocardial coronary artery bridge and elevated calcium score    History of Present Illness:    Steven Bishop is a 74 y.o. 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 Compliance with diet, lifestyle and  medications: Yes  He is a very vigorous active man has no exertional chest pain.  He does have some indigestion and belches at times with relief.  Tolerates his lipid-lowering therapy without muscle pain or weakness compliant with his medications no chest pain shortness of breath palpitation or syncope Past Medical History:  Diagnosis Date  . Chest pain 12/18/2018  . Complication of anesthesia    " It takes a while for me to wake up."  . Diabetes mellitus ([place])   . Distal radius fracture, right   . Prostate cancer [facility])     Past Surgical History:  Procedure Laterality Date  . CERVICAL SPINE SURGERY    . ORIF RADIAL FRACTURE Right 01/11/2015   Procedure: OPEN REDUCTION INTERNAL FIXATION (ORIF) RIGHT RADIAL FRACTURE WITH REPAIR AND RECONSTRUCTION;  Surgeon: Roseanne Kaufman, MD;  Location: [place];  Service: Orthopedics;  Laterality: Right;  . PROSTATECTOMY    . TONSILLECTOMY AND ADENOIDECTOMY      Current Medications: Current Meds  Medication Sig  . albuterol (VENTOLIN HFA) 108 (90 Base) MCG/ACT inhaler SMARTSIG:2 Puff(s) By Mouth Every 6 Hours PRN  . Ascorbic Acid (VITAMIN C) 100 MG tablet Take 100 mg by mouth daily.  Marland Kitchen aspirin EC 81 MG tablet Take 81 mg by mouth daily.  . [place]-Glucosamine-Vit D (OSTEO BI-FLEX ONE PER DAY PO) Take 1 tablet by mouth daily.  . Calcium Carbonate (CALCIUM 600 PO) Take 600 mg by mouth daily.  . cholecalciferol (  VITAMIN D) 1000 UNITS tablet Take 1,000 Units by mouth daily.  Marland Kitchen KRILL OIL PO Take 1 capsule by mouth daily.  . LUTEIN PO Take 1 capsule by mouth daily.  . nitroGLYCERIN (NITROSTAT) 0.4 MG SL tablet DISSOLVE 1 TABLET UNDER TONGUE EVERY 5 MIN UP TO 3 TIMES AS NEEDED FOR CHEST PAIN 3TABS/15MIN  . Omega-3 Fatty Acids (FISH OIL PO) Take 1 capsule by mouth daily.  . pioglitazone (ACTOS) 30 MG tablet Take 30 mg by mouth daily.  . pravastatin (PRAVACHOL) 40 MG tablet TAKE 1 TABLET BY MOUTH EVERY DAY IN THE EVENING  . vitamin E 100 UNIT capsule Take 100  Units by mouth daily.     Allergies:   Patient has no known allergies.   Social History   Socioeconomic History  . Marital status: Married    Spouse name: Not on file  . Number of children: Not on file  . Years of education: Not on file  . Highest education level: Not on file  Occupational History  . Not on file  Tobacco Use  . Smoking status: Former Smoker    Packs/day: 1.50    Years: 20.00    Pack years: 30.00    Types: Cigarettes  . Smokeless tobacco: Never Used  Vaping Use  . Vaping Use: Never used  Substance and Sexual Activity  . Alcohol use: No  . Drug use: No  . Sexual activity: Not on file  Other Topics Concern  . Not on file  Social History Narrative  . Not on file   Social Determinants of Health   Financial Resource Strain:   . Difficulty of Paying Living Expenses: Not on file  Food Insecurity:   . Worried About Charity fundraiser in the Last Year: Not on file  . Ran Out of Food in the Last Year: Not on file  Transportation Needs:   . Lack of Transportation (Medical): Not on file  . Lack of Transportation (Non-Medical): Not on file  Physical Activity:   . Days of Exercise per Week: Not on file  . Minutes of Exercise per Session: Not on file  Stress:   . Feeling of Stress : Not on file  Social Connections:   . Frequency of Communication with Friends and Family: Not on file  . Frequency of Social Gatherings with Friends and Family: Not on file  . Attends Religious Services: Not on file  . Active Member of Clubs or Organizations: Not on file  . Attends Archivist Meetings: Not on file  . Marital Status: Not on file     Family History: The patient's family history includes Heart Problems in his mother and sister; Kidney failure in his father; Macular degeneration in his mother. ROS:   Please see the history of present illness.    All other systems reviewed and are negative.  EKGs/Labs/Other Studies Reviewed:    The following studies  were reviewed today:  EKG:  EKG ordered today and personally reviewed.  The ekg ordered today demonstrates sinus rhythm nonspecific T waves  Recent Labs: No results found for requested labs within last 8760 hours.  Recent Lipid Panel No results found for: CHOL, TRIG, HDL, CHOLHDL, VLDL, LDLCALC, LDLDIRECT  Physical Exam:    VS:  BP 118/68 (BP Location: Right Arm, Patient Position: Sitting, Cuff Size: Normal)   Pulse 63   Ht 5\' 11"  (1.803 m)   Wt 216 lb 9.6 oz (98.2 kg)   SpO2 97%   BMI  30.21 kg/m     Wt Readings from Last 3 Encounters:  03/09/20 216 lb 9.6 oz (98.2 kg)  02/05/19 209 lb 6.4 oz (95 kg)  12/18/18 207 lb 12.8 oz (94.3 kg)     GEN:  Well nourished, well developed in no acute distress HEENT: Normal NECK: No JVD; No carotid bruits LYMPHATICS: No lymphadenopathy CARDIAC: RRR, no murmurs, rubs, gallops RESPIRATORY:  Clear to auscultation without rales, wheezing or rhonchi  ABDOMEN: Soft, non-tender, non-distended MUSCULOSKELETAL:  No edema; No deformity  SKIN: Warm and dry NEUROLOGIC:  Alert and oriented x 3 PSYCHIATRIC:  Normal affect    Signed, Shirlee More, MD  03/09/2020 3:00 PM    [place]

## 2020-03-10 LAB — LIPID PANEL
Chol/HDL Ratio: 2.8 ratio (ref 0.0–5.0)
Cholesterol, Total: 117 mg/dL (ref 100–199)
HDL: 42 mg/dL (ref >39)
LDL Chol Calc (NIH): 34 mg/dL (ref 0–99)
Triglycerides: 271 mg/dL — ABNORMAL HIGH (ref 0–149)
VLDL Cholesterol Cal: 41 mg/dL — ABNORMAL HIGH (ref 5–40)

## 2020-03-10 LAB — COMPREHENSIVE METABOLIC PANEL
ALT: 15 IU/L (ref 0–44)
AST: 18 IU/L (ref 0–40)
Albumin/Globulin Ratio: 2 (ref 1.2–2.2)
Albumin: 4.3 g/dL (ref 3.7–4.7)
Alkaline Phosphatase: 57 IU/L (ref 44–121)
BUN/Creatinine Ratio: 22 (ref 10–24)
BUN: 19 mg/dL (ref 8–27)
Bilirubin Total: 0.4 mg/dL (ref 0.0–1.2)
CO2: 21 mmol/L (ref 20–29)
Calcium: 9.3 mg/dL (ref 8.6–10.2)
Chloride: 105 mmol/L (ref 96–106)
Creatinine, Ser: 0.86 mg/dL (ref 0.76–1.27)
GFR calc Af Amer: 99 mL/min/{1.73_m2} (ref >59)
GFR calc non Af Amer: 86 mL/min/{1.73_m2} (ref >59)
Globulin, Total: 2.1 g/dL (ref 1.5–4.5)
Glucose: 109 mg/dL — ABNORMAL HIGH (ref 65–99)
Potassium: 4.1 mmol/L (ref 3.5–5.2)
Sodium: 139 mmol/L (ref 134–144)
Total Protein: 6.4 g/dL (ref 6.0–8.5)

## 2020-03-14 ENCOUNTER — Telehealth: Payer: Self-pay

## 2020-03-14 NOTE — Telephone Encounter (Signed)
Left message on patients voicemail to please return our call.   

## 2020-03-14 NOTE — Telephone Encounter (Signed)
-----   Message from Richardo Priest, MD sent at 03/13/2020 11:29 AM EST ----- Good result no changes

## 2020-03-14 NOTE — Telephone Encounter (Signed)
Spoke with patient regarding results and recommendation.  Patient verbalizes understanding and is agreeable to plan of care. Advised patient to call back with any issues or concerns.  

## 2020-05-09 ENCOUNTER — Other Ambulatory Visit: Payer: Self-pay | Admitting: Cardiology

## 2020-05-10 NOTE — Telephone Encounter (Signed)
Rx refill sent to pharmacy. 

## 2020-06-20 ENCOUNTER — Other Ambulatory Visit: Payer: Self-pay

## 2020-06-20 MED ORDER — PRAVASTATIN SODIUM 40 MG PO TABS: ORAL_TABLET | ORAL | 1 refills | Status: DC

## 2020-06-20 NOTE — Telephone Encounter (Signed)
Pravastatin approved and sent to [facility]

## 2020-09-07 NOTE — Progress Notes (Signed)
Cardiology Office Note:    Date:  09/08/2020   ID:  Steven Bishop, DOB Sep 04, 1945, MRN [id number]  PCP:  Enid Skeens., MD  Cardiologist:  Shirlee More, MD    Referring MD: Enid Skeens., MD    ASSESSMENT:    1. Coronary-myocardial bridge   2. Type 2 diabetes mellitus with complication, with long-term current use of insulin ([place])   3. Dyslipidemia   4. Mild CAD    PLAN:    In order of problems listed above:  1. He continues to do well no anginal discomfort very vigorous active golfs almost daily and is meticulous about his health continue treatment including aspirin and lipid-lowering with Pravachol. 2. Stable he is due for an A1c in the next few weeks and lipid profile with his PCP if he requires an additional diabetic medication GLP-1 agent would be ideal cardioprotective aspect 3. Stable lipids at target was due for labs with his PCP at next visit continue current 4. See #1 asymptomatic [place] Heart Association class I mild nonobstructive CAD on appropriate antiplatelet lipid-lowering and diabetic therapy   Next appointment: 1 year   Medication Adjustments/Labs and Tests Ordered: Current medicines are reviewed at length with the patient today.  Concerns regarding medicines are outlined above.  No orders of the defined types were placed in this encounter.  No orders of the defined types were placed in this encounter.   Chief Complaint  Patient presents with  . Follow-up  . Coronary Artery Disease    History of Present Illness:    Steven Bishop is a 75 y.o. male with a hx of chest pain with coronary calcium score 141 45th percentile for age and sex nonobstructive CAD and myocardial coronary artery bridging proximal mid LAD type 2 diabetes and dyslipidemia 03/09/2020 last seen.  Compliance with diet, lifestyle and medications: Yes  He continues to do well vigorous active no anginal discomfort in his case he is asymptomatic with this mild nonobstructive CAD  coronary artery bridge. He tolerates his statin without muscle pain or weakness No edema shortness of breath palpitation or syncope  Past Medical History:  Diagnosis Date  . Chest pain 12/18/2018  . Complication of anesthesia    " It takes a while for me to wake up."  . Diabetes mellitus ([place])   . Distal radius fracture, right   . Prostate cancer [facility])     Past Surgical History:  Procedure Laterality Date  . CERVICAL SPINE SURGERY    . ORIF RADIAL FRACTURE Right 01/11/2015   Procedure: OPEN REDUCTION INTERNAL FIXATION (ORIF) RIGHT RADIAL FRACTURE WITH REPAIR AND RECONSTRUCTION;  Surgeon: Roseanne Kaufman, MD;  Location: [place];  Service: Orthopedics;  Laterality: Right;  . PROSTATECTOMY    . TONSILLECTOMY AND ADENOIDECTOMY      Current Medications: Current Meds  Medication Sig  . albuterol (VENTOLIN HFA) 108 (90 Base) MCG/ACT inhaler every 4 (four) hours as needed for wheezing or shortness of breath.  . Ascorbic Acid (VITAMIN C) 100 MG tablet Take 100 mg by mouth daily.  Marland Kitchen aspirin EC 81 MG tablet Take 81 mg by mouth daily.  . [place]-Glucosamine-Vit D (OSTEO BI-FLEX ONE PER DAY PO) Take 1 tablet by mouth daily.  . Calcium Carbonate (CALCIUM 600 PO) Take 600 mg by mouth daily.  . cholecalciferol (VITAMIN D) 1000 UNITS tablet Take 1,000 Units by mouth daily.  Marland Kitchen KRILL OIL PO Take 1 capsule by mouth daily.  . LUTEIN PO Take 1 capsule  by mouth daily.  . nitroGLYCERIN (NITROSTAT) 0.4 MG SL tablet DISSOLVE 1 TABLET UNDER TONGUE EVERY 5 MIN UP TO 3 TIMES AS NEEDED FOR CHEST PAIN 3TABS/15MIN  . Omega-3 Fatty Acids (FISH OIL PO) Take 1 capsule by mouth daily.  . pioglitazone (ACTOS) 30 MG tablet Take 30 mg by mouth daily.  . pravastatin (PRAVACHOL) 40 MG tablet TAKE 1 TABLET BY MOUTH ONCE DAILY IN THE EVENING  . vitamin E 100 UNIT capsule Take 100 Units by mouth daily.     Allergies:   Patient has no known allergies.   Social History   Socioeconomic History  . Marital status: Married     Spouse name: Not on file  . Number of children: Not on file  . Years of education: Not on file  . Highest education level: Not on file  Occupational History  . Not on file  Tobacco Use  . Smoking status: Former Smoker    Packs/day: 1.50    Years: 20.00    Pack years: 30.00    Types: Cigarettes  . Smokeless tobacco: Never Used  Vaping Use  . Vaping Use: Never used  Substance and Sexual Activity  . Alcohol use: No  . Drug use: No  . Sexual activity: Not on file  Other Topics Concern  . Not on file  Social History Narrative  . Not on file   Social Determinants of Health   Financial Resource Strain: Not on file  Food Insecurity: Not on file  Transportation Needs: Not on file  Physical Activity: Not on file  Stress: Not on file  Social Connections: Not on file     Family History: The patient's family history includes Heart Problems in his mother and sister; Kidney failure in his father; Macular degeneration in his mother. ROS:   Please see the history of present illness.    All other systems reviewed and are negative.  EKGs/Labs/Other Studies Reviewed:    The following studies were reviewed today:   Recent Labs: 03/09/2020: ALT 15; BUN 19; Creatinine, Ser 0.86; Potassium 4.1; Sodium 139  Recent Lipid Panel    Component Value Date/Time   CHOL 117 03/09/2020 1453   TRIG 271 (H) 03/09/2020 1453   HDL 42 03/09/2020 1453   CHOLHDL 2.8 03/09/2020 1453   LDLCALC 34 03/09/2020 1453    Physical Exam:    VS:  BP 100/60 (BP Location: Left Arm, Patient Position: Sitting)   Pulse 62   Ht 5\' 11"  (1.803 m)   Wt 210 lb 9.6 oz (95.5 kg)   SpO2 97%   BMI 29.37 kg/m     Wt Readings from Last 3 Encounters:  09/08/20 210 lb 9.6 oz (95.5 kg)  03/09/20 216 lb 9.6 oz (98.2 kg)  02/05/19 209 lb 6.4 oz (95 kg)     GEN:  Well nourished, well developed in no acute distress HEENT: Normal NECK: No JVD; No carotid bruits LYMPHATICS: No lymphadenopathy CARDIAC: RRR, no  murmurs, rubs, gallops RESPIRATORY:  Clear to auscultation without rales, wheezing or rhonchi  ABDOMEN: Soft, non-tender, non-distended MUSCULOSKELETAL:  No edema; No deformity  SKIN: Warm and dry NEUROLOGIC:  Alert and oriented x 3 PSYCHIATRIC:  Normal affect    Signed, Shirlee More, MD  09/08/2020 10:09 AM    [facility]

## 2020-09-08 ENCOUNTER — Ambulatory Visit: Payer: PPO | Admitting: Cardiology

## 2020-09-08 ENCOUNTER — Encounter: Payer: Self-pay | Admitting: Cardiology

## 2020-09-08 ENCOUNTER — Other Ambulatory Visit: Payer: Self-pay

## 2020-09-08 VITALS — BP 100/60 | HR 62 | Ht 71.0 in | Wt 210.6 lb

## 2020-09-08 DIAGNOSIS — E118 Type 2 diabetes mellitus with unspecified complications: Secondary | ICD-10-CM | POA: Diagnosis not present

## 2020-09-08 DIAGNOSIS — I2510 Atherosclerotic heart disease of native coronary artery without angina pectoris: Secondary | ICD-10-CM

## 2020-09-08 DIAGNOSIS — E785 Hyperlipidemia, unspecified: Secondary | ICD-10-CM | POA: Diagnosis not present

## 2020-09-08 DIAGNOSIS — Q245 Malformation of coronary vessels: Secondary | ICD-10-CM

## 2020-09-08 DIAGNOSIS — Z794 Long term (current) use of insulin: Secondary | ICD-10-CM

## 2020-09-08 NOTE — Patient Instructions (Signed)

## 2020-10-13 IMAGING — CT CT HEART MORP W/ CTA COR W/ SCORE W/ CA W/CM &/OR W/O CM
4 of 7 series · 8 of 20 positions shown, 9 images · IV contrast (APPLIED)
Comparison: 12/03/2018 chest radiograph.
COMPARISON: 12/03/2018 chest radiograph.

Addendum:
EXAM:
OVER-READ INTERPRETATION  CT CHEST

The following report is an over-read performed by radiologist Dr.
Iraklius Alsaghbini [REDACTED] on 01/13/2019. This over-read
does not include interpretation of cardiac or coronary anatomy or
pathology. The CTA interpretation by the cardiologist is attached.
HISTORY: Chest pain, cardiac etiology suspected chest pain
Cardiac/Coronary CT
TECHNIQUE: The patient was scanned on a Siemens Force scanner.
PROTOCOL: A 120 kV prospective scan was triggered in the descending thoracic
aorta at 111 HU's. Axial non-contrast 3 mm slices were carried out
through the heart. The data set was analyzed on a dedicated work
station and scored using the Agatson method. Gantry rotation speed
was 250 msecs and collimation was 0.6 mm. Beta blockade and 0.8 mg
of sl NTG was given. The 3D data set was reconstructed in 5%
intervals of 35-75% of the R-R cycle. Diastolic phases were analyzed
on a dedicated work station using MPR, MIP and VRT modes. The
patient received 80mL OMNIPAQUE IOHEXOL 350 MG/ML SOLN of contrast.

[Series 6: best diast 70 % · axial · 0.39mm/px · z∈[-219,-172]mm · 2 of 356 slices shown]
[im 119/356  vessel]
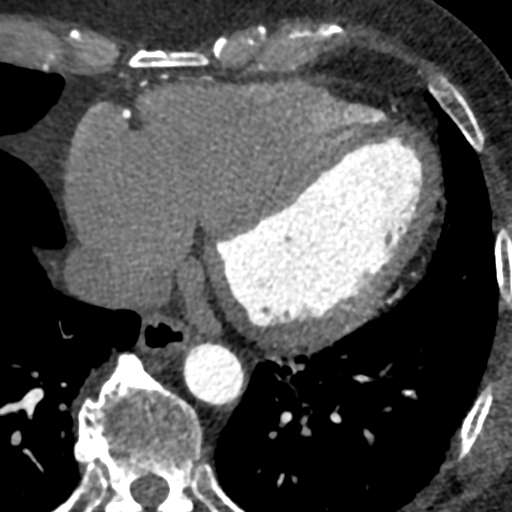
[im 237/356  vessel]
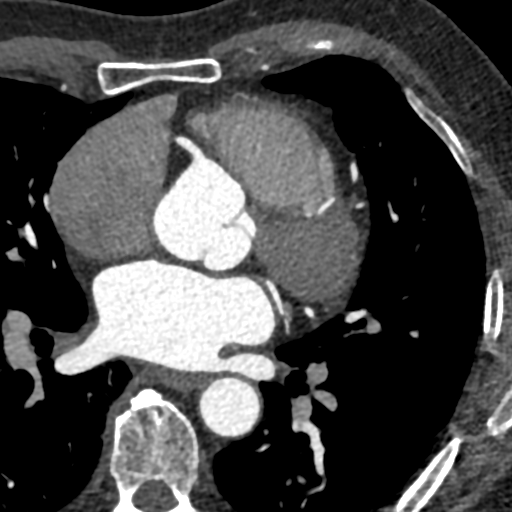

[Series 7: best syst · axial · 0.39mm/px · z∈[-219,-172]mm · 2 of 356 slices shown, 3 images]
[im 119/356  vessel]
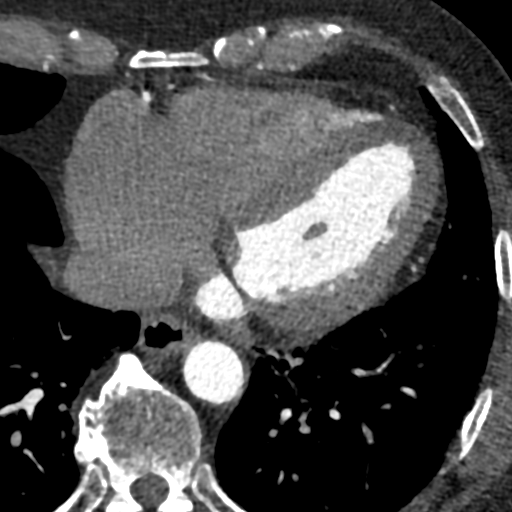
[im 119/356  lung]
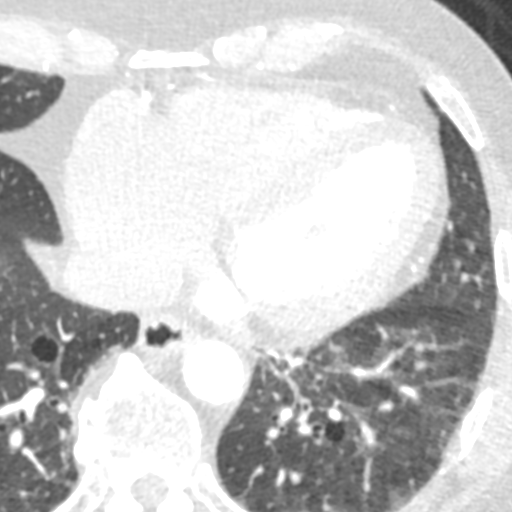
[im 237/356  vessel]
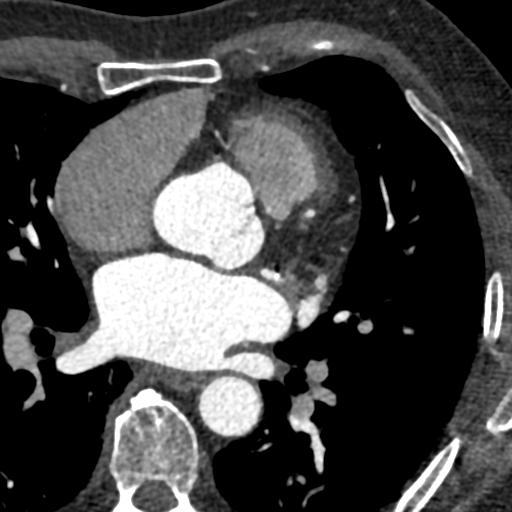

[Series 8: ts diast sharp 70 % · axial · 0.39mm/px · z∈[-219,-172]mm · 2 of 356 slices shown]
[im 119/356  lung]
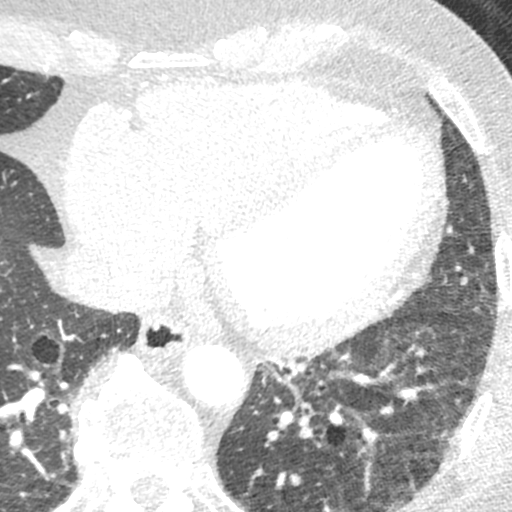
[im 237/356  lung]
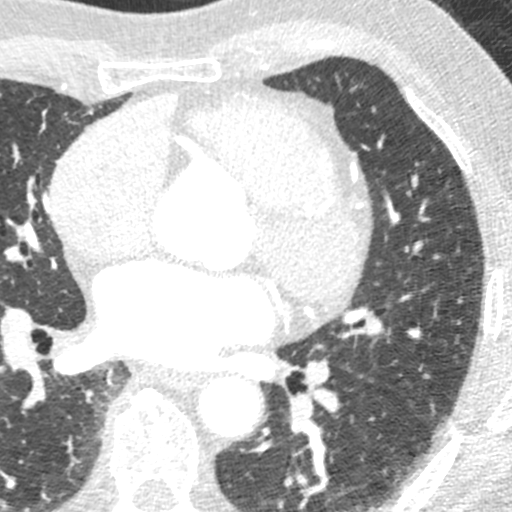

[Series 9: ts syst sharp · axial · 0.39mm/px · z∈[-219,-172]mm · 2 of 356 slices shown]
[im 119/356  lung]
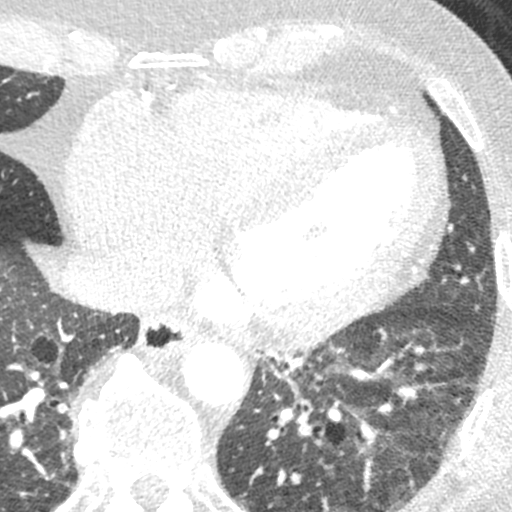
[im 237/356  lung]
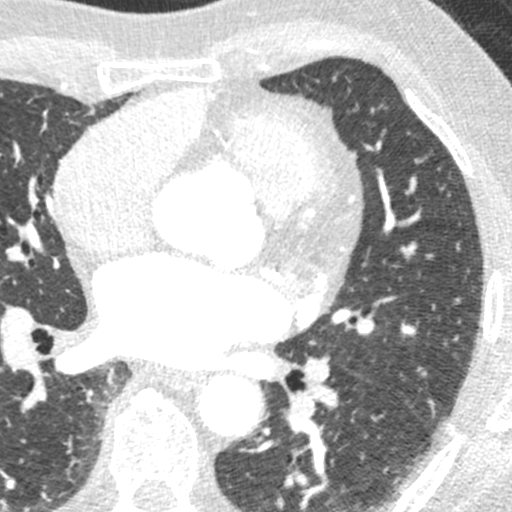

[8 of 20 positions shown; findings below may reference images not displayed]

FINDINGS: Vascular: Normal aortic caliber, without evidence of dissection. No
central pulmonary embolism, on this non-dedicated study.

Mediastinum/Nodes: No imaged thoracic adenopathy.

Lungs/Pleura: No imaged pleural fluid. Mild centrilobular emphysema.

Upper Abdomen: Normal imaged portions of the spleen. Proximal
gastric underdistention. Tiny low-density liver lesions are likely
cysts or bile duct hamartomas.

Musculoskeletal: Posterior right rib remote fracture on 33/11.
Moderate mid and lower thoracic spondylosis.
IMPRESSION: No acute findings in the imaged extracardiac chest.

Emphysema ([id number]).
FINDINGS: Coronary calcium score: The patient's coronary artery calcium score
is 141, which places the patient in the 45 percentile.

Coronary arteries: Normal coronary origins.  Right dominance.

Right Coronary Artery: Proximal RCA with calcified plaque with 1-24%
stenosis. Mid RCA with calcified plaque with 1-24% stenosis. Distal
RCA gives rise to PDA and PLB, no significant plaque or stenosis.

Left Main Coronary Artery: Short course, small calcified plaque at
distal portion with 1-24% stenosis.

Left Anterior Descending Coronary Artery: No significant plaque or
stenosis within LAD itself. Vessel gives rise to small D1 and medium
D2 that has proximal calcified plaque with 25-49% stenosis. Caliber
of vessel is approximately 2mm at the area of stenosis. There is a
myocardial bridge in the proximal to mid LAD, approximately 20 mm
long.

Left Circumflex Artery: Proximal LCx with two small areas of
calcified plaque, each with 1-24% stenosis. Gives rise to large OM1
and OM2, no significant plaque or stenosis in OM branches.

Aorta: Normal size, 33 mm at the mid ascending aorta (level of the
PA bifurcation) measured double oblique. Mild calcifications. No
dissection.

Aortic Valve: Trivial calcifications. Trileaflet.

Other findings:

Normal pulmonary vein drainage into the left atrium.

Normal left atrial appendage without a thrombus.

Normal size of the pulmonary artery.
IMPRESSION: 1. Mild nonobstructive CAD, CADRADS = 2.

2. Coronary calcium score of 141. This was 45 percentile for age and
sex matched control.

3. Normal coronary origin with right dominance.

4.  Myocardial bridge in proximal to mid LAD.

*** End of Addendum ***
EXAM:
OVER-READ INTERPRETATION  CT CHEST

The following report is an over-read performed by radiologist Dr.
Iraklius Alsaghbini [REDACTED] on 01/13/2019. This over-read
does not include interpretation of cardiac or coronary anatomy or
pathology. The CTA interpretation by the cardiologist is attached.
FINDINGS: Vascular: Normal aortic caliber, without evidence of dissection. No
central pulmonary embolism, on this non-dedicated study.

Mediastinum/Nodes: No imaged thoracic adenopathy.

Lungs/Pleura: No imaged pleural fluid. Mild centrilobular emphysema.

Upper Abdomen: Normal imaged portions of the spleen. Proximal
gastric underdistention. Tiny low-density liver lesions are likely
cysts or bile duct hamartomas.

Musculoskeletal: Posterior right rib remote fracture on 33/11.
Moderate mid and lower thoracic spondylosis.
IMPRESSION: No acute findings in the imaged extracardiac chest.

Emphysema ([id number]).

## 2021-01-19 ENCOUNTER — Telehealth: Payer: Self-pay | Admitting: Cardiology

## 2021-01-19 MED ORDER — PRAVASTATIN SODIUM 40 MG PO TABS: ORAL_TABLET | ORAL | 3 refills | Status: DC

## 2021-01-19 NOTE — Telephone Encounter (Signed)
Refill sent in per request.  

## 2021-01-19 NOTE — Telephone Encounter (Signed)
 *  STAT* If patient is at the pharmacy, call can be transferred to refill team.   1. Which medications need to be refilled? (please list name of each medication and dose if known) pravastatin (PRAVACHOL) 40 MG tablet  2. Which pharmacy/location (including street and city if local pharmacy) is medication to be sent to? [facility], [place] [address]  3. Do they need a 30 day or 90 day supply? 90 days

## 2021-09-07 ENCOUNTER — Encounter: Payer: Self-pay | Admitting: Cardiology

## 2021-09-07 ENCOUNTER — Ambulatory Visit: Payer: Medicare HMO | Admitting: Cardiology

## 2021-09-07 VITALS — BP 104/58 | HR 69 | Ht 71.0 in | Wt 210.4 lb

## 2021-09-07 DIAGNOSIS — I2510 Atherosclerotic heart disease of native coronary artery without angina pectoris: Secondary | ICD-10-CM | POA: Diagnosis not present

## 2021-09-07 DIAGNOSIS — Q245 Malformation of coronary vessels: Secondary | ICD-10-CM | POA: Diagnosis not present

## 2021-09-07 DIAGNOSIS — E118 Type 2 diabetes mellitus with unspecified complications: Secondary | ICD-10-CM

## 2021-09-07 DIAGNOSIS — E785 Hyperlipidemia, unspecified: Secondary | ICD-10-CM

## 2021-09-07 DIAGNOSIS — R931 Abnormal findings on diagnostic imaging of heart and coronary circulation: Secondary | ICD-10-CM | POA: Diagnosis not present

## 2021-09-07 DIAGNOSIS — Z794 Long term (current) use of insulin: Secondary | ICD-10-CM

## 2021-09-07 MED ORDER — ROSUVASTATIN CALCIUM 5 MG PO TABS: 5.0000 mg | ORAL_TABLET | Freq: Every day | ORAL | 3 refills | Status: AC

## 2021-09-07 NOTE — Progress Notes (Signed)
Cardiology Office Note:    Date:  09/07/2021   ID:  Steven Bishop, DOB 1945-05-11, MRN [id number]  PCP:  Steven Bishop., MD  Cardiologist:  Steven More, MD    Referring MD: Steven Bishop., MD with his CAD may benefit transition from Actos to one of the GLP-1 agents cardioprotection for diabetes   ASSESSMENT:    1. Mild CAD   2. Agatston coronary artery calcium score between 100 and 199   3. Coronary-myocardial bridge   4. Type 2 diabetes mellitus with complication, with long-term current use of insulin ([place])   5. Dyslipidemia    PLAN:    In order of problems listed above:  Steven Bishop continues to do well with his mild CAD and myocardial bridge asymptomatic having no angina we will continue treatment including aspirin nitroglycerin as needed and restart his statin.  Follow-up labs in his PCP office Stable, he takes Actos which could be a concern with CAD might need to consider alternative therapy especially GLP-1 agent from cardiovascular perspective   Next appointment: 1 year   Medication Adjustments/Labs and Tests Ordered: Current medicines are reviewed at length with the patient today.  Concerns regarding medicines are outlined above.  No orders of the defined types were placed in this encounter.  No orders of the defined types were placed in this encounter.   Chief Complaint  Patient presents with   Follow-up   Coronary Artery Disease    History of Present Illness:    Steven Bishop is a 76 y.o. male with a hx of coronary atherosclerosis with a calcium score 141/45th percentile nonobstructive CAD and myocardial coronary artery bridge LAD diabetes and dyslipidemia last seen 09/08/2020.  Compliance with diet, lifestyle and medications: Yes  He is retired and enjoying life.  Vigorous active golfs on a frequent basis and has no exercise intolerance chest pain edema shortness of breath palpitation or syncope He was constipated stop pravastatin we will reinitiate with  rosuvastatin and follow-up labs in July and with his PCP Past Medical History:  Diagnosis Date   Chest pain 08/20/3891   Complication of anesthesia    " It takes a while for me to wake up."   Diabetes mellitus ([place])    Distal radius fracture, right    Prostate cancer [facility])     Past Surgical History:  Procedure Laterality Date   CERVICAL SPINE SURGERY     ORIF RADIAL FRACTURE Right 01/11/2015   Procedure: OPEN REDUCTION INTERNAL FIXATION (ORIF) RIGHT RADIAL FRACTURE WITH REPAIR AND RECONSTRUCTION;  Surgeon: Roseanne Kaufman, MD;  Location: [place];  Service: Orthopedics;  Laterality: Right;   PROSTATECTOMY     TONSILLECTOMY AND ADENOIDECTOMY      Current Medications: Current Meds  Medication Sig   albuterol (VENTOLIN HFA) 108 (90 Base) MCG/ACT inhaler every 4 (four) hours as needed for wheezing or shortness of breath.   Ascorbic Acid (VITAMIN C) 100 MG tablet Take 100 mg by mouth daily.   aspirin EC 81 MG tablet Take 81 mg by mouth daily.   Boswellia-Glucosamine-Vit D (OSTEO BI-FLEX ONE PER DAY PO) Take 1 tablet by mouth daily.   Calcium Carbonate (CALCIUM 600 PO) Take 600 mg by mouth daily.   cholecalciferol (VITAMIN D) 1000 UNITS tablet Take 1,000 Units by mouth daily.   KRILL OIL PO Take 1 capsule by mouth daily.   LUTEIN PO Take 1 capsule by mouth daily.   nitroGLYCERIN (NITROSTAT) 0.4 MG SL tablet DISSOLVE 1 TABLET UNDER TONGUE  EVERY 5 MIN UP TO 3 TIMES AS NEEDED FOR CHEST PAIN 3TABS/15MIN   Omega-3 Fatty Acids (FISH OIL PO) Take 1 capsule by mouth daily.   pioglitazone (ACTOS) 30 MG tablet Take 30 mg by mouth daily.   vitamin E 100 UNIT capsule Take 100 Units by mouth daily.     Allergies:   Pravastatin   Social History   Socioeconomic History   Marital status: Married    Spouse name: Not on file   Number of children: Not on file   Years of education: Not on file   Highest education level: Not on file  Occupational History   Not on file  Tobacco Use   Smoking status:  Former    Packs/day: 1.50    Years: 20.00    Pack years: 30.00    Types: Cigarettes    Passive exposure: Past   Smokeless tobacco: Never  Vaping Use   Vaping Use: Never used  Substance and Sexual Activity   Alcohol use: No   Drug use: No   Sexual activity: Not on file  Other Topics Concern   Not on file  Social History Narrative   Not on file   Social Determinants of Health   Financial Resource Strain: Not on file  Food Insecurity: Not on file  Transportation Needs: Not on file  Physical Activity: Not on file  Stress: Not on file  Social Connections: Not on file     Family History: The patient's  family history includes Heart Problems in his mother and sister; Kidney failure in his father; Macular degeneration in his mother. ROS:   Please see the history of present illness.    All other systems reviewed and are negative.  EKGs/Labs/Other Studies Reviewed:    The following studies were reviewed today:  Cardiac CTA 01/13/2019: IMPRESSION: 1. 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  25 to 49% stenosis proximal LAD 1 to 24% stenosis left main right coronary left circumflex coronary artery. 2. Coronary calcium score of 141. This was 46 percentile for age and sex matched control. 3. Normal coronary origin with right dominance. 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  EKG:  EKG ordered today and personally reviewed.  The ekg ordered today demonstrates sinus rhythm normal 1 PVC  Recent Labs: No results found for requested labs within last 8760 hours.  Recent Lipid Panel    Component Value Date/Time   CHOL 117 03/09/2020 1453   TRIG 271 (H) 03/09/2020 1453   HDL 42 03/09/2020 1453   CHOLHDL 2.8 03/09/2020 1453   LDLCALC 34 03/09/2020 1453    Physical Exam:    VS:  BP (!) 104/58 (BP Location: Right Arm, Patient Position: Sitting)   Pulse 69   Ht '5\' 11"'$  (1.803 m)   Wt 210 lb 6.4 oz (95.4 kg)   SpO2 99%   BMI 29.34 kg/m     Wt Readings from Last 3  Encounters:  09/07/21 210 lb 6.4 oz (95.4 kg)  09/08/20 210 lb 9.6 oz (95.5 kg)  03/09/20 216 lb 9.6 oz (98.2 kg)     GEN:   Well nourished, well developed in no acute distress HEENT: Normal NECK: No JVD; No carotid bruits LYMPHATICS: No lymphadenopathy CARDIAC:  RRR, no murmurs, rubs, gallops RESPIRATORY:  Clear to auscultation without rales, wheezing or rhonchi  ABDOMEN: Soft, non-tender, non-distended MUSCULOSKELETAL:  No edema; No deformity  SKIN: Warm and dry NEUROLOGIC:  Alert and oriented x 3 PSYCHIATRIC:  Normal affect  Signed, Steven More, MD  09/07/2021 1:01 PM    [place]

## 2021-09-07 NOTE — Patient Instructions (Signed)
Medication Instructions:  Your physician has recommended you make the following change in your medication:   START: Rosouvastatin 5 mg daily  *If you need a refill on your cardiac medications before your next appointment, please call your pharmacy*   Lab Work: None If you have labs (blood work) drawn today and your tests are completely normal, you will receive your results only by: [facility] (if you have MyChart) OR A paper copy in the mail If you have any lab test that is abnormal or we need to change your treatment, we will call you to review the results.   Testing/Procedures: None   Follow-Up: At [facility], you and your health needs are our priority.  As part of our continuing mission to provide you with exceptional heart care, we have created designated Provider Care Teams.  These Care Teams include your primary Cardiologist (physician) and Advanced Practice Providers (APPs -  Physician Assistants and Nurse Practitioners) who all work together to provide you with the care you need, when you need it.  We recommend signing up for the patient portal called "MyChart".  Sign up information is provided on this After Visit Summary.  MyChart is used to connect with patients for Virtual Visits (Telemedicine).  Patients are able to view lab/test results, encounter notes, upcoming appointments, etc.  Non-urgent messages can be sent to your provider as well.   To learn more about what you can do with MyChart, go to [email or web address].    Your next appointment:   1 year(s)  The format for your next appointment:   In Person  Provider:   Shirlee More, MD    Other Instructions None  Important Information About Sugar

## 2022-09-04 ENCOUNTER — Other Ambulatory Visit: Payer: Self-pay

## 2022-09-10 NOTE — Progress Notes (Unsigned)
Cardiology Office Note:    Date:  09/11/2022   ID:  Steven Bishop, DOB [birth year], MRN [id number]  PCP:  Nonnie Done., MD  Cardiologist:  Norman Herrlich, MD    Referring MD: Nonnie Done., MD    ASSESSMENT:    1. Mild CAD   2. Agatston coronary artery calcium score between 100 and 199   3. Coronary-myocardial bridge   4. Dyslipidemia   5. Type 2 diabetes mellitus with complication, with long-term current use of insulin (HCC)    PLAN:    In order of problems listed above:  Steven Bishop continues to do well with CAD New York Heart Association class I having no anginal discomfort he has a good activity program I encouraged him to continue the same and continue his medical therapy including aspirin high intensity statin Crestor LDL at target and his diabetic treatment with A1c of 5.  He has nitroglycerin to use as needed I asked him if he is taking it to contact my office I do a repeat ischemia evaluation.  At this time I would not advise cardiac diagnostic imaging.   Next appointment: 1 year   Medication Adjustments/Labs and Tests Ordered: Current medicines are reviewed at length with the patient today.  Concerns regarding medicines are outlined above.  Orders Placed This Encounter  Procedures   EKG 12-Lead   No orders of the defined types were placed in this encounter.   Chief Complaint  Patient presents with   Coronary Artery Disease    History of Present Illness:    Steven Bishop is a 77 y.o. male with a hx of coronary atherosclerosis with a calcium score of 100.41/45th percentile nonobstructive CAD and myocardial coronary artery bridge left anterior descending coronary artery type 2 diabetes and dyslipidemia last seen 09/07/2021.  Compliance with diet, lifestyle and medications: Yes  Steven Bishop has had a good year he has had no cardiovascular symptoms chest pain edema shortness of breath palpitation or syncope He has venous varicosities and gets a bit lightheaded if he  shifts posture quickly he is careful He is on lipid-lowering therapy and says he has upcoming labs next month with his PCP and his A1c has been at target.  Most recent labs 05/08/2022 cholesterol 106 LDL 44 triglycerides 60 HDL 40 his CMP had a glucose of 120 potassium 4.6 normal liver function test.  A1c 6.0 Past Medical History:  Diagnosis Date   Chest pain 12/18/2018   Complication of anesthesia    " It takes a while for me to wake up."   Diabetes mellitus (HCC)    Distal radius fracture, right    Prostate cancer [facility])     Past Surgical History:  Procedure Laterality Date   CERVICAL SPINE SURGERY     ORIF RADIAL FRACTURE Right 01/11/2015   Procedure: OPEN REDUCTION INTERNAL FIXATION (ORIF) RIGHT RADIAL FRACTURE WITH REPAIR AND RECONSTRUCTION;  Surgeon: Dominica Severin, MD;  Location: [facility] OR;  Service: Orthopedics;  Laterality: Right;   PROSTATECTOMY     TONSILLECTOMY AND ADENOIDECTOMY      Current Medications: Current Meds  Medication Sig   albuterol (VENTOLIN HFA) 108 (90 Base) MCG/ACT inhaler Inhale 1-2 puffs into the lungs every 4 (four) hours as needed for wheezing or shortness of breath.   Ascorbic Acid (VITAMIN C) 100 MG tablet Take 100 mg by mouth daily.   aspirin EC 81 MG tablet Take 81 mg by mouth daily.   Boswellia-Glucosamine-Vit D (OSTEO BI-FLEX ONE PER DAY PO)  Take 1 tablet by mouth daily.   Calcium Carbonate (CALCIUM 600 PO) Take 600 mg by mouth daily.   cholecalciferol (VITAMIN D) 1000 UNITS tablet Take 1,000 Units by mouth daily.   KRILL OIL PO Take 1 capsule by mouth daily.   LUTEIN PO Take 1 capsule by mouth daily.   nitroGLYCERIN (NITROSTAT) 0.4 MG SL tablet Place 0.4 mg under the tongue every 5 (five) minutes as needed for chest pain.   Omega-3 Fatty Acids (FISH OIL PO) Take 1 capsule by mouth daily.   pioglitazone (ACTOS) 30 MG tablet Take 30 mg by mouth daily.   rosuvastatin (CRESTOR) 5 MG tablet Take 1 tablet (5 mg total) by mouth daily.   vitamin E 100 UNIT  capsule Take 100 Units by mouth daily.     Allergies:   Pravastatin   Social History   Socioeconomic History   Marital status: Married    Spouse name: Not on file   Number of children: Not on file   Years of education: Not on file   Highest education level: Not on file  Occupational History   Not on file  Tobacco Use   Smoking status: Former    Packs/day: 1.50    Years: 20.00    Additional pack years: 0.00    Total pack years: 30.00    Types: Cigarettes    Passive exposure: Past   Smokeless tobacco: Never  Vaping Use   Vaping Use: Never used  Substance and Sexual Activity   Alcohol use: No   Drug use: No   Sexual activity: Not on file  Other Topics Concern   Not on file  Social History Narrative   Not on file   Social Determinants of Health   Financial Resource Strain: Not on file  Food Insecurity: Not on file  Transportation Needs: Not on file  Physical Activity: Not on file  Stress: Not on file  Social Connections: Not on file     Family History: The patient's family history includes Heart Problems in his mother and sister; Kidney failure in his father; Macular degeneration in his mother. ROS:   Please see the history of present illness.    All other systems reviewed and are negative.  EKGs/Labs/Other Studies Reviewed:    The following studies were reviewed today:  Cardiac Studies & Procedures         MONITORS  LONG TERM MONITOR (3-14 DAYS) 01/22/2019  Narrative An event monitor was performed for 14 days beginning 12/25/2018 in order to assess syncope.  The rhythm throughout is sinus with minimum average and maximum heart rates of 45, 64 and 107 bpm.  The minimum rate is sinus bradycardia.  There were no pauses of 3 seconds or greater and no episodes of AV node or sinus node block.  Ventricular ectopy was rare with isolated PVCs couplets and bigeminy 7.7 seconds trigeminy 6.3 seconds.  Supraventricular arrhythmia was rare with APCs.  There were  13 brief runs of atrial premature contractions the longest 15 complexes at a rate of 109 bpm, atrial tachycardia.  The fastest 4 beats at a rate of 179 bpm.  There are no episodes of atrial fibrillation or flutter.  There were 2 diary events 1 with palpitation associated with infrequent PVC There were 5 triggered events 1 associated with infrequent PVC   Conclusion unremarkable 14-day ZIO monitor to assess syncope, no significant bradycardia seen.  There is rare supraventricular and ventricular ectopy present and the triggered and diary events are  predominantly unassociated with PVCs   CT SCANS  CT CORONARY MORPH W/CTA COR W/SCORE 01/15/2019  Addendum 01/15/2019 11:11 AM ADDENDUM REPORT: 01/15/2019 11:09  HISTORY: Chest pain, cardiac etiology suspected chest pain  EXAM: Cardiac/Coronary CT  TECHNIQUE: The patient was scanned on a [employer].  PROTOCOL: A 120 kV prospective scan was triggered in the descending thoracic aorta at 111 HU's. Axial non-contrast 3 mm slices were carried out through the heart. The data set was analyzed on a dedicated work station and scored using the Agatson method. Gantry rotation speed was 250 msecs and collimation was 0.6 mm. Beta blockade and 0.8 mg of sl NTG was given. The 3D data set was reconstructed in 5% intervals of 35-75% of the R-R cycle. Diastolic phases were analyzed on a dedicated work station using MPR, MIP and VRT modes. The patient received 80mL OMNIPAQUE IOHEXOL 350 MG/ML SOLN of contrast.  FINDINGS: Coronary calcium score: The patient's coronary artery calcium score is 141, which places the patient in the 45 percentile.  Coronary arteries: Normal coronary origins.  Right dominance.  Right Coronary Artery: Proximal RCA with calcified plaque with 1-24% stenosis. Mid RCA with calcified plaque with 1-24% stenosis. Distal RCA gives rise to PDA and PLB, no significant plaque or stenosis.  Left Main Coronary Artery: Short  course, small calcified plaque at distal portion with 1-24% stenosis.  Left Anterior Descending Coronary Artery: No significant plaque or stenosis within LAD itself. Vessel gives rise to small D1 and medium D2 that has proximal calcified plaque with 25-49% stenosis. Caliber of vessel is approximately 2mm at the area of stenosis. There is a myocardial bridge in the proximal to mid LAD, approximately 20 mm long.  Left Circumflex Artery: Proximal LCx with two small areas of calcified plaque, each with 1-24% stenosis. Gives rise to large OM1 and OM2, no significant plaque or stenosis in OM branches.  Aorta: Normal size, 33 mm at the mid ascending aorta (level of the PA bifurcation) measured double oblique. Mild calcifications. No dissection.  Aortic Valve: Trivial calcifications. Trileaflet.  Other findings:  Normal pulmonary vein drainage into the left atrium.  Normal left atrial appendage without a thrombus.  Normal size of the pulmonary artery.  IMPRESSION: 1. 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  2. Coronary calcium score of 141. This was 45 percentile for age and sex matched control.  3. Normal coronary origin with right dominance.  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   Electronically Signed By: Jodelle Red M.D. On: 01/15/2019 11:09  Narrative EXAM: OVER-READ INTERPRETATION  CT CHEST  The following report is an over-read performed by radiologist Dr. Jeronimo Greaves of [facility] on 01/13/2019. This over-read does not include interpretation of cardiac or coronary anatomy or pathology. The CTA interpretation by the cardiologist is attached.  COMPARISON:  12/03/2018 chest radiograph.  FINDINGS: Vascular: Normal aortic caliber, without evidence of dissection. No central pulmonary embolism, on this non-dedicated study.  Mediastinum/Nodes: No imaged thoracic adenopathy.  Lungs/Pleura: No imaged pleural fluid. Mild centrilobular  emphysema.  Upper Abdomen: Normal imaged portions of the spleen. Proximal gastric underdistention. Tiny low-density liver lesions are likely cysts or bile duct hamartomas.  Musculoskeletal: Posterior right rib remote fracture on 33/11. Moderate mid and lower thoracic spondylosis.  IMPRESSION: No acute findings in the imaged extracardiac chest.  Emphysema (ICD10-J43.9).  Electronically Signed: By: Jeronimo Greaves M.D. On: 01/13/2019 09:14          EKG:  EKG ordered today  and personally reviewed.  The ekg ordered today demonstrates sinus rhythm T wave abnormality similar to previous EKGs  Recent Labs: No results found for requested labs within last 365 days.  Recent Lipid Panel    Component Value Date/Time   CHOL 117 03/09/2020 1453   TRIG 271 (H) 03/09/2020 1453   HDL 42 03/09/2020 1453   CHOLHDL 2.8 03/09/2020 1453   LDLCALC 34 03/09/2020 1453    Physical Exam:    VS:  BP 132/60   Pulse 60   Ht 5\' 11"  (1.803 m)   Wt 209 lb 6.4 oz (95 kg)   SpO2 97%   BMI 29.21 kg/m     Wt Readings from Last 3 Encounters:  09/11/22 209 lb 6.4 oz (95 kg)  09/07/21 210 lb 6.4 oz (95.4 kg)  09/08/20 210 lb 9.6 oz (95.5 kg)     GEN:  Well nourished, well developed in no acute distress HEENT: Normal NECK: No JVD; No carotid bruits LYMPHATICS: No lymphadenopathy CARDIAC: RRR, no murmurs, rubs, gallops RESPIRATORY:  Clear to auscultation without rales, wheezing or rhonchi  ABDOMEN: Soft, non-tender, non-distended MUSCULOSKELETAL:  No edema; No deformity  SKIN: Warm and dry NEUROLOGIC:  Alert and oriented x 3 PSYCHIATRIC:  Normal affect    Signed, Norman Herrlich, MD  09/11/2022 8:18 AM     [facility]

## 2022-09-11 ENCOUNTER — Ambulatory Visit: Payer: Medicare HMO | Attending: Cardiology | Admitting: Cardiology

## 2022-09-11 ENCOUNTER — Encounter: Payer: Self-pay | Admitting: Cardiology

## 2022-09-11 VITALS — BP 132/60 | HR 60 | Ht 71.0 in | Wt 209.4 lb

## 2022-09-11 DIAGNOSIS — E785 Hyperlipidemia, unspecified: Secondary | ICD-10-CM | POA: Diagnosis not present

## 2022-09-11 DIAGNOSIS — I2510 Atherosclerotic heart disease of native coronary artery without angina pectoris: Secondary | ICD-10-CM | POA: Diagnosis not present

## 2022-09-11 DIAGNOSIS — R931 Abnormal findings on diagnostic imaging of heart and coronary circulation: Secondary | ICD-10-CM

## 2022-09-11 DIAGNOSIS — Q245 Malformation of coronary vessels: Secondary | ICD-10-CM

## 2022-09-11 DIAGNOSIS — E118 Type 2 diabetes mellitus with unspecified complications: Secondary | ICD-10-CM

## 2022-09-11 DIAGNOSIS — Z794 Long term (current) use of insulin: Secondary | ICD-10-CM

## 2022-09-11 NOTE — Patient Instructions (Addendum)
Medication Instructions:  Your physician recommends that you continue on your current medications as directed. Please refer to the Current Medication list given to you today.  *If you need a refill on your cardiac medications before your next appointment, please call your pharmacy*   Lab Work: None If you have labs (blood work) drawn today and your tests are completely normal, you will receive your results only by: MyChart Message (if you have MyChart) OR A paper copy in the mail If you have any lab test that is abnormal or we need to change your treatment, we will call you to review the results.   Testing/Procedures: None   Follow-Up: At [facility], you and your health needs are our priority.  As part of our continuing mission to provide you with exceptional heart care, we have created designated Provider Care Teams.  These Care Teams include your primary Cardiologist (physician) and Advanced Practice Providers (APPs -  Physician Assistants and Nurse Practitioners) who all work together to provide you with the care you need, when you need it.  We recommend signing up for the patient portal called "MyChart".  Sign up information is provided on this After Visit Summary.  MyChart is used to connect with patients for Virtual Visits (Telemedicine).  Patients are able to view lab/test results, encounter notes, upcoming appointments, etc.  Non-urgent messages can be sent to your provider as well.   To learn more about what you can do with MyChart, go to [email or web address].    Your next appointment:   1 year(s)  Provider:   Hani Patnode, MD    Other Instructions None  

## 2023-07-29 NOTE — Progress Notes (Signed)
 Anesthesia Review:  PCP: Cardiologist : Steven Bishop 09/11/22   PPM/ ICD: Device Orders: Rep Notified:  Chest x-ray : EKG : Echo : CT cors- 2020  Monitor- 2020  Stress test: Cardiac Cath :   Activity level:  Sleep Study/ CPAP : Fasting Blood Sugar :      / Checks Blood Sugar -- times a day:   DM- tyep  hgbA1c-    Actos- none am of surgery   Blood Thinner/ Instructions /Last Dose: ASA / Instructions/ Last Dose :  81 mg aspirin

## 2023-07-29 NOTE — Patient Instructions (Signed)
 SURGICAL WAITING ROOM VISITATION  Patients having surgery or a procedure may have no more than 2 support people in the waiting area - these visitors may rotate.    Children under the age of 51 must have an adult with them who is not the patient.  Due to an increase in RSV and influenza rates and associated hospitalizations, children ages 21 and under may not visit patients in [facility] hospitals.  Visitors with respiratory illnesses are discouraged from visiting and should remain at home.  If the patient needs to stay at the hospital during part of their recovery, the visitor guidelines for inpatient rooms apply. Pre-op nurse will coordinate an appropriate time for 1 support person to accompany patient in pre-op.  This support person may not rotate.    Please refer to the [facility] website for the visitor guidelines for Inpatients (after your surgery is over and you are in a regular room).       Your procedure is scheduled on:  08/06/2023    Report to [facility] Main Entrance    Report to admitting at  0730 AM   Call this number if you have problems the morning of surgery [phone]   Do not eat food :After Midnight.   After Midnight you may have the following liquids until __ 0700____ AM DAY OF SURGERY  Water Non-Citrus Juices (without pulp, NO RED-Apple, White grape, White cranberry) Black Coffee (NO MILK/CREAM OR CREAMERS, sugar ok)  Clear Tea (NO MILK/CREAM OR CREAMERS, sugar ok) regular and decaf                             Plain Jell-O (NO RED)                                           Fruit ices (not with fruit pulp, NO RED)                                     Popsicles (NO RED)                                                               Sports drinks like Gatorade (NO RED)                   The day of surgery:  Drink ONE (1) Pre-Surgery Clear Ensure or G2 at  0700 AM  ( have completed by ) the morning of surgery. Drink in one sitting. Do not sip.   This drink was given to you during your hospital  pre-op appointment visit. Nothing else to drink after completing the  Pre-Surgery Clear Ensure or G2.          If you have questions, please contact your surgeon's office.       Oral Hygiene is also important to reduce your risk of infection.  Remember - BRUSH YOUR TEETH THE MORNING OF SURGERY WITH YOUR REGULAR TOOTHPASTE  DENTURES WILL BE REMOVED PRIOR TO SURGERY PLEASE DO NOT APPLY "Poly grip" OR ADHESIVES!!!   Do NOT smoke after Midnight   Stop all vitamins and herbal supplements 7 days before surgery.   Take these medicines the morning of surgery with A SIP OF WATER:  inhalers as usual and bring   DO NOT TAKE ANY ORAL DIABETIC MEDICATIONS DAY OF YOUR SURGERY  Bring CPAP mask and tubing day of surgery.                              You may not have any metal on your body including hair pins, jewelry, and body piercing             Do not wear make-up, lotions, powders, perfumes/cologne, or deodorant  Do not wear nail polish including gel and S&S, artificial/acrylic nails, or any other type of covering on natural nails including finger and toenails. If you have artificial nails, gel coating, etc. that needs to be removed by a nail salon please have this removed prior to surgery or surgery may need to be canceled/ delayed if the surgeon/ anesthesia feels like they are unable to be safely monitored.   Do not shave  48 hours prior to surgery.               Men may shave face and neck.   Do not bring valuables to the hospital. [facility] IS NOT             RESPONSIBLE   FOR VALUABLES.   Contacts, glasses, dentures or bridgework may not be worn into surgery.   Bring small overnight bag day of surgery.   DO NOT BRING YOUR HOME MEDICATIONS TO THE HOSPITAL. PHARMACY WILL DISPENSE MEDICATIONS LISTED ON YOUR MEDICATION LIST TO YOU DURING YOUR ADMISSION IN THE HOSPITAL!    Patients discharged on  the day of surgery will not be allowed to drive home.  Someone NEEDS to stay with you for the first 24 hours after anesthesia.   Special Instructions: Bring a copy of your healthcare power of attorney and living will documents the day of surgery if you haven't scanned them before.              Please read over the following fact sheets you were given: IF YOU HAVE QUESTIONS ABOUT YOUR PRE-OP INSTRUCTIONS PLEASE CALL [phone]   If you received a COVID test during your pre-op visit  it is requested that you wear a mask when out in public, stay away from anyone that may not be feeling well and notify your surgeon if you develop symptoms. If you test positive for Covid or have been in contact with anyone that has tested positive in the last 10 days please notify you surgeon.      Pre-operative 5 CHG Bath Instructions   You can play a key role in reducing the risk of infection after surgery. Your skin needs to be as free of germs as possible. You can reduce the number of germs on your skin by washing with CHG (chlorhexidine gluconate) soap before surgery. CHG is an antiseptic soap that kills germs and continues to kill germs even after washing.   DO NOT use if you have an allergy to chlorhexidine/CHG or antibacterial soaps. If your skin becomes reddened or irritated, stop using the CHG and notify one  of our RNs at [phone].   Please shower with the CHG soap starting 4 days before surgery using the following schedule:     Please keep in mind the following:  DO NOT shave, including legs and underarms, starting the day of your first shower.   You may shave your face at any point before/day of surgery.  Place clean sheets on your bed the day you start using CHG soap. Use a clean washcloth (not used since being washed) for each shower. DO NOT sleep with pets once you start using the CHG.   CHG Shower Instructions:  If you choose to wash your hair and private area, wash first with your  normal shampoo/soap.  After you use shampoo/soap, rinse your hair and body thoroughly to remove shampoo/soap residue.  Turn the water OFF and apply about 3 tablespoons (45 ml) of CHG soap to a CLEAN washcloth.  Apply CHG soap ONLY FROM YOUR NECK DOWN TO YOUR TOES (washing for 3-5 minutes)  DO NOT use CHG soap on face, private areas, open wounds, or sores.  Pay special attention to the area where your surgery is being performed.  If you are having back surgery, having someone wash your back for you may be helpful. Wait 2 minutes after CHG soap is applied, then you may rinse off the CHG soap.  Pat dry with a clean towel  Put on clean clothes/pajamas   If you choose to wear lotion, please use ONLY the CHG-compatible lotions on the back of this paper.     Additional instructions for the day of surgery: DO NOT APPLY any lotions, deodorants, cologne, or perfumes.   Put on clean/comfortable clothes.  Brush your teeth.  Ask your nurse before applying any prescription medications to the skin.      CHG Compatible Lotions   Aveeno Moisturizing lotion  Cetaphil Moisturizing Cream  Cetaphil Moisturizing Lotion  Clairol Herbal Essence Moisturizing Lotion, Dry Skin  Clairol Herbal Essence Moisturizing Lotion, Extra Dry Skin  Clairol Herbal Essence Moisturizing Lotion, Normal Skin  Curel Age Defying Therapeutic Moisturizing Lotion with Alpha Hydroxy  Curel Extreme Care Body Lotion  Curel Soothing Hands Moisturizing Hand Lotion  Curel Therapeutic Moisturizing Cream, Fragrance-Free  Curel Therapeutic Moisturizing Lotion, Fragrance-Free  Curel Therapeutic Moisturizing Lotion, Original Formula  Eucerin Daily Replenishing Lotion  Eucerin Dry Skin Therapy Plus Alpha Hydroxy Crme  Eucerin Dry Skin Therapy Plus Alpha Hydroxy Lotion  Eucerin Original Crme  Eucerin Original Lotion  Eucerin Plus Crme Eucerin Plus Lotion  Eucerin TriLipid Replenishing Lotion  Keri Anti-Bacterial Hand Lotion  Keri  Deep Conditioning Original Lotion Dry Skin Formula Softly Scented  Keri Deep Conditioning Original Lotion, Fragrance Free Sensitive Skin Formula  Keri Lotion Fast Absorbing Fragrance Free Sensitive Skin Formula  Keri Lotion Fast Absorbing Softly Scented Dry Skin Formula  Keri Original Lotion  Keri Skin Renewal Lotion Keri Silky Smooth Lotion  Keri Silky Smooth Sensitive Skin Lotion  Nivea Body Creamy Conditioning Oil  Nivea Body Extra Enriched Teacher, adult education Moisturizing Lotion Nivea Crme  Nivea Skin Firming Lotion  NutraDerm 30 Skin Lotion  NutraDerm Skin Lotion  NutraDerm Therapeutic Skin Cream  NutraDerm Therapeutic Skin Lotion  ProShield Protective Hand Cream  Provon moisturizing lotion

## 2023-07-30 ENCOUNTER — Encounter (HOSPITAL_COMMUNITY): Payer: Self-pay

## 2023-07-30 ENCOUNTER — Encounter (HOSPITAL_COMMUNITY)
Admission: RE | Admit: 2023-07-30 | Discharge: 2023-07-30 | Disposition: A | Discharge status: Home / Self Care | Source: Ambulatory Visit | Attending: Orthopedic Surgery | Admitting: Orthopedic Surgery

## 2023-07-30 ENCOUNTER — Other Ambulatory Visit: Payer: Self-pay

## 2023-07-30 VITALS — BP 128/69 | HR 67 | Temp 98.1°F | Resp 16 | Ht 71.0 in | Wt 194.0 lb

## 2023-07-30 DIAGNOSIS — Z01818 Encounter for other preprocedural examination: Secondary | ICD-10-CM | POA: Diagnosis present

## 2023-07-30 DIAGNOSIS — Z79899 Other long term (current) drug therapy: Secondary | ICD-10-CM | POA: Diagnosis not present

## 2023-07-30 DIAGNOSIS — Z01812 Encounter for preprocedural laboratory examination: Secondary | ICD-10-CM | POA: Diagnosis not present

## 2023-07-30 HISTORY — DX: Pneumonia, unspecified organism: J18.9

## 2023-07-30 HISTORY — DX: Unspecified osteoarthritis, unspecified site: M19.90

## 2023-07-30 LAB — BASIC METABOLIC PANEL WITH GFR
Anion gap: 9 (ref 5–15)
BUN: 18 mg/dL (ref 8–23)
CO2: 23 mmol/L (ref 22–32)
Calcium: 9.5 mg/dL (ref 8.9–10.3)
Chloride: 106 mmol/L (ref 98–111)
Creatinine, Ser: 1.1 mg/dL (ref 0.61–1.24)
GFR, Estimated: >60 mL/min (ref >60)
Glucose, Bld: 111 mg/dL — ABNORMAL HIGH (ref 70–99)
Potassium: 4.5 mmol/L (ref 3.5–5.1)
Sodium: 138 mmol/L (ref 135–145)

## 2023-07-30 LAB — CBC
HCT: 39.9 % (ref 39.0–52.0)
Hemoglobin: 13.2 g/dL (ref 13.0–17.0)
MCH: 33 pg (ref 26.0–34.0)
MCHC: 33.1 g/dL (ref 30.0–36.0)
MCV: 99.8 fL (ref 80.0–100.0)
Platelets: 160 10*3/uL (ref 150–400)
RBC: 4 MIL/uL — ABNORMAL LOW (ref 4.22–5.81)
RDW: 12.9 % (ref 11.5–15.5)
WBC: 4.4 10*3/uL (ref 4.0–10.5)
nRBC: 0 % (ref 0.0–0.2)

## 2023-07-30 LAB — SURGICAL PCR SCREEN
MRSA, PCR: NEGATIVE
Staphylococcus aureus: NEGATIVE

## 2023-07-30 LAB — GLUCOSE, CAPILLARY: Glucose-Capillary: 96 mg/dL (ref 70–99)

## 2023-07-31 LAB — HEMOGLOBIN A1C
Hgb A1c MFr Bld: 5.9 % — ABNORMAL HIGH (ref 4.8–5.6)
Mean Plasma Glucose: 123 mg/dL

## 2023-08-04 NOTE — H&P (Signed)
 TOTAL KNEE ADMISSION H&P  Patient is being admitted for right total knee arthroplasty.  Therapy Plans: outpatient therapy at [facility] Disposition: Home with wife Planned DVT Prophylaxis: aspirin 81mg  BID DME needed: walker PCP: Dr. Robbin Chill - clearance received TXA: IV Allergies: NKDA Anesthesia Concerns: none BMI: 29.7 Last HgbA1c: 6.1%   Other: - Planning on SDD - oxycodone , robaxin  ,tylenol, meloxicam - hx of prostatectomy for prostate cancer - No hx of VTE   Subjective:  Chief Complaint:right knee pain.  HPI: Steven Bishop, 78 y.o. male, has a history of pain and functional disability in the right knee due to arthritis and has failed non-surgical conservative treatments for greater than 12 weeks to includeNSAID's and/or analgesics, corticosteriod injections, and activity modification.  Onset of symptoms was gradual, starting 2 years ago with gradually worsening course since that time. The patient noted no past surgery on the right knee(s).  Patient currently rates pain in the right knee(s) at 7 out of 10 with activity. Patient has worsening of pain with activity and weight bearing and pain that interferes with activities of daily living.  Patient has evidence of joint space narrowing by imaging studies. There is no active infection.  Patient Active Problem List   Diagnosis Date Noted   Prostate cancer [facility])    Distal radius fracture, right    Diabetes mellitus (HCC)    Complication of anesthesia    Chest pain 12/18/2018   Past Medical History:  Diagnosis Date   Arthritis    Chest pain 12/18/2018   Complication of anesthesia    " It takes a while for me to wake up."   Diabetes mellitus (HCC)    Distal radius fracture, right    Pneumonia    hx of 11/24   Prostate cancer [facility])     Past Surgical History:  Procedure Laterality Date   CERVICAL SPINE SURGERY     ORIF RADIAL FRACTURE Right 01/11/2015   Procedure: OPEN REDUCTION INTERNAL FIXATION (ORIF) RIGHT RADIAL  FRACTURE WITH REPAIR AND RECONSTRUCTION;  Surgeon: Ronn Cohn, MD;  Location: [facility] OR;  Service: Orthopedics;  Laterality: Right;   PROSTATECTOMY     TONSILLECTOMY AND ADENOIDECTOMY      No current facility-administered medications for this encounter.   Current Outpatient Medications  Medication Sig Dispense Refill Last Dose/Taking   albuterol (VENTOLIN HFA) 108 (90 Base) MCG/ACT inhaler Inhale 1-2 puffs into the lungs every 4 (four) hours as needed for wheezing or shortness of breath.   Taking As Needed   amoxicillin-clavulanate (AUGMENTIN) 875-125 MG tablet Take 1 tablet by mouth 2 (two) times daily.   Taking   Ascorbic Acid (VITAMIN C) 1000 MG tablet Take 2,000 mg by mouth 2 (two) times daily. Lunch and Dinner   Taking   aspirin EC 81 MG tablet Take 81 mg by mouth daily.   Taking   Calcium  Carb-Cholecalciferol (CALCIUM  600 + D PO) Take 1 tablet by mouth daily.   Taking   Cholecalciferol (VITAMIN D) 50 MCG (2000 UT) tablet Take 2,000 Units by mouth 3 (three) times daily with meals.   Taking   KRILL OIL PO Take 600 mg by mouth daily.   Taking   Lutein 20 MG CAPS Take 20 mg by mouth daily.   Taking   nitroGLYCERIN  (NITROSTAT ) 0.4 MG SL tablet Place 0.4 mg under the tongue every 5 (five) minutes as needed for chest pain.   Taking As Needed   Omega-3 Fatty Acids (FISH OIL PO) Take 4 capsules  by mouth daily.   Taking   pioglitazone (ACTOS) 30 MG tablet Take 30 mg by mouth daily.   Taking   rosuvastatin  (CRESTOR ) 5 MG tablet Take 1 tablet (5 mg total) by mouth daily. 90 tablet 3 Taking   TURMERIC PO Take 1,000 mg by mouth daily.   Taking   vitamin E 180 MG (400 UNITS) capsule Take 400 Units by mouth daily.   Taking   Allergies  Allergen Reactions   Pravastatin  Other (See Comments)    Severe Constipation    Social History   Tobacco Use   Smoking status: Former    Current packs/day: 1.50    Average packs/day: 1.5 packs/day for 20.0 years (30.0 ttl pk-yrs)    Types: Cigarettes     Passive exposure: Past   Smokeless tobacco: Never  Substance Use Topics   Alcohol use: No    Family History  Problem Relation Age of Onset   Macular degeneration Mother    Heart Problems Mother    Kidney failure Father    Heart Problems Sister      Review of Systems  Constitutional:  Negative for chills and fever.  Respiratory:  Negative for cough and shortness of breath.   Cardiovascular:  Negative for chest pain.  Gastrointestinal:  Negative for nausea and vomiting.  Musculoskeletal:  Positive for arthralgias.     Objective:  Physical Exam Well nourished and well developed. General: Alert and oriented x3, cooperative and pleasant, no acute distress.  Musculoskeletal: Right knee exam: No palpable effusion, warmth erythema Slight flexion contracture with flexion over 110 degrees Most of his tenderness is over the medial and anterior aspect knee Tightness with right hip exam but without groin pain or referred pain Neurovascular intact distally Left knee exam reveals mild tenderness to the medial aspect the knee but not as significant as the right knee.   Vital signs in last 24 hours:    Labs:   Estimated body mass index is 27.06 kg/m as calculated from the following:   Height as of 07/30/23: 5\' 11"  (1.803 m).   Weight as of 07/30/23: 88 kg.   Imaging Review Plain radiographs demonstrate severe degenerative joint disease of the right knee(s). The overall alignment isneutral. The bone quality appears to be adequate for age and reported activity level.      Assessment/Plan:  End stage arthritis, right knee   The patient history, physical examination, clinical judgment of the provider and imaging studies are consistent with end stage degenerative joint disease of the right knee(s) and total knee arthroplasty is deemed medically necessary. The treatment options including medical management, injection therapy arthroscopy and arthroplasty were discussed at length.  The risks and benefits of total knee arthroplasty were presented and reviewed. The risks due to aseptic loosening, infection, stiffness, patella tracking problems, thromboembolic complications and other imponderables were discussed. The patient acknowledged the explanation, agreed to proceed with the plan and consent was signed. Patient is being admitted for inpatient treatment for surgery, pain control, PT, OT, prophylactic antibiotics, VTE prophylaxis, progressive ambulation and ADL's and discharge planning. The patient is planning to be discharged  home.     Patient's anticipated LOS is less than 2 midnights, meeting these requirements: - Younger than 54 - Lives within 1 hour of care - Has a competent adult at home to recover with post-op recover - NO history of  - Chronic pain requiring opiods  - Diabetes  - Coronary Artery Disease  - Heart failure  - Heart  attack  - Stroke  - DVT/VTE  - Cardiac arrhythmia  - Respiratory Failure/COPD  - Renal failure  - Anemia  - Advanced Liver disease  Kim Pen, PA-C Orthopedic Surgery [facility] [place] [phone]

## 2023-08-06 ENCOUNTER — Encounter (HOSPITAL_COMMUNITY): Admission: RE | Payer: Self-pay | Source: Ambulatory Visit

## 2023-08-06 ENCOUNTER — Encounter (HOSPITAL_COMMUNITY): Payer: Self-pay | Admitting: Anesthesiology

## 2023-08-06 ENCOUNTER — Encounter (HOSPITAL_COMMUNITY): Payer: Self-pay | Admitting: Physician Assistant

## 2023-08-06 ENCOUNTER — Ambulatory Visit (HOSPITAL_COMMUNITY): Admission: RE | Admit: 2023-08-06 | Payer: Medicare HMO | Source: Ambulatory Visit | Admitting: Orthopedic Surgery

## 2023-08-06 SURGERY — ARTHROPLASTY, KNEE, TOTAL
Anesthesia: Spinal | Site: Knee | Laterality: Right

## 2023-08-20 NOTE — Patient Instructions (Signed)
 SURGICAL WAITING ROOM VISITATION  Patients having surgery or a procedure may have no more than 2 support people in the waiting area - these visitors may rotate.    Children under the age of 75 must have an adult with them who is not the patient.  Due to an increase in RSV and influenza rates and associated hospitalizations, children ages 45 and under may not visit patients in [facility] hospitals.  Visitors with respiratory illnesses are discouraged from visiting and should remain at home.  If the patient needs to stay at the hospital during part of their recovery, the visitor guidelines for inpatient rooms apply. Pre-op nurse will coordinate an appropriate time for 1 support person to accompany patient in pre-op.  This support person may not rotate.    Please refer to the [facility] website for the visitor guidelines for Inpatients (after your surgery is over and you are in a regular room).       Your procedure is scheduled on:  09/03/2023    Report to [facility] Main Entrance    Report to admitting at  1145 AM   Call this number if you have problems the morning of surgery [phone]   Do not eat food :After Midnight.   After Midnight you may have the following liquids until __ 1115____ AM  DAY OF SURGERY  Water Non-Citrus Juices (without pulp, NO RED-Apple, White grape, White cranberry) Black Coffee (NO MILK/CREAM OR CREAMERS, sugar ok)  Clear Tea (NO MILK/CREAM OR CREAMERS, sugar ok) regular and decaf                             Plain Jell-O (NO RED)                                           Fruit ices (not with fruit pulp, NO RED)                                     Popsicles (NO RED)                                                               Sports drinks like Gatorade (NO RED)                     The day of surgery:  Drink ONE (1) Pre-Surgery Clear Ensure or G2 at  1115 AM ( have completed by )  the morning of surgery. Drink in one sitting. Do not sip.   This drink was given to you during your hospital  pre-op appointment visit. Nothing else to drink after completing the  Pre-Surgery Clear Ensure or G2.          If you have questions, please contact your surgeon's office.      Oral Hygiene is also important to reduce your risk of infection.  Remember - BRUSH YOUR TEETH THE MORNING OF SURGERY WITH YOUR REGULAR TOOTHPASTE  DENTURES WILL BE REMOVED PRIOR TO SURGERY PLEASE DO NOT APPLY "Poly grip" OR ADHESIVES!!!   Do NOT smoke after Midnight   Stop all vitamins and herbal supplements 7 days before surgery.   Take these medicines the morning of surgery with A SIP OF WATER:  inhalers as usual and bring,   Actos- none am of surgery   DO NOT TAKE ANY ORAL DIABETIC MEDICATIONS DAY OF YOUR SURGERY  Bring CPAP mask and tubing day of surgery.                              You may not have any metal on your body including hair pins, jewelry, and body piercing             Do not wear make-up, lotions, powders, perfumes/cologne, or deodorant  Do not wear nail polish including gel and S&S, artificial/acrylic nails, or any other type of covering on natural nails including finger and toenails. If you have artificial nails, gel coating, etc. that needs to be removed by a nail salon please have this removed prior to surgery or surgery may need to be canceled/ delayed if the surgeon/ anesthesia feels like they are unable to be safely monitored.   Do not shave  48 hours prior to surgery.               Men may shave face and neck.   Do not bring valuables to the hospital. [facility] IS NOT             RESPONSIBLE   FOR VALUABLES.   Contacts, glasses, dentures or bridgework may not be worn into surgery.   Bring small overnight bag day of surgery.   DO NOT BRING YOUR HOME MEDICATIONS TO THE HOSPITAL. PHARMACY WILL DISPENSE MEDICATIONS LISTED ON YOUR MEDICATION LIST TO YOU DURING YOUR ADMISSION IN THE  HOSPITAL!    Patients discharged on the day of surgery will not be allowed to drive home.  Someone NEEDS to stay with you for the first 24 hours after anesthesia.   Special Instructions: Bring a copy of your healthcare power of attorney and living will documents the day of surgery if you haven't scanned them before.              Please read over the following fact sheets you were given: IF YOU HAVE QUESTIONS ABOUT YOUR PRE-OP INSTRUCTIONS PLEASE CALL [phone]   If you received a COVID test during your pre-op visit  it is requested that you wear a mask when out in public, stay away from anyone that may not be feeling well and notify your surgeon if you develop symptoms. If you test positive for Covid or have been in contact with anyone that has tested positive in the last 10 days please notify you surgeon.      Pre-operative 5 CHG Bath Instructions   You can play a key role in reducing the risk of infection after surgery. Your skin needs to be as free of germs as possible. You can reduce the number of germs on your skin by washing with CHG (chlorhexidine  gluconate) soap before surgery. CHG is an antiseptic soap that kills germs and continues to kill germs even after washing.   DO NOT use if you have an allergy to chlorhexidine /CHG or antibacterial soaps. If your skin becomes reddened or irritated,  stop using the CHG and notify one of our RNs at [phone].   Please shower with the CHG soap starting 4 days before surgery using the following schedule:     Please keep in mind the following:  DO NOT shave, including legs and underarms, starting the day of your first shower.   You may shave your face at any point before/day of surgery.  Place clean sheets on your bed the day you start using CHG soap. Use a clean washcloth (not used since being washed) for each shower. DO NOT sleep with pets once you start using the CHG.   CHG Shower Instructions:  If you choose to wash your hair and  private area, wash first with your normal shampoo/soap.  After you use shampoo/soap, rinse your hair and body thoroughly to remove shampoo/soap residue.  Turn the water OFF and apply about 3 tablespoons (45 ml) of CHG soap to a CLEAN washcloth.  Apply CHG soap ONLY FROM YOUR NECK DOWN TO YOUR TOES (washing for 3-5 minutes)  DO NOT use CHG soap on face, private areas, open wounds, or sores.  Pay special attention to the area where your surgery is being performed.  If you are having back surgery, having someone wash your back for you may be helpful. Wait 2 minutes after CHG soap is applied, then you may rinse off the CHG soap.  Pat dry with a clean towel  Put on clean clothes/pajamas   If you choose to wear lotion, please use ONLY the CHG-compatible lotions on the back of this paper.     Additional instructions for the day of surgery: DO NOT APPLY any lotions, deodorants, cologne, or perfumes.   Put on clean/comfortable clothes.  Brush your teeth.  Ask your nurse before applying any prescription medications to the skin.      CHG Compatible Lotions   Aveeno Moisturizing lotion  Cetaphil Moisturizing Cream  Cetaphil Moisturizing Lotion  Clairol Herbal Essence Moisturizing Lotion, Dry Skin  Clairol Herbal Essence Moisturizing Lotion, Extra Dry Skin  Clairol Herbal Essence Moisturizing Lotion, Normal Skin  Curel Age Defying Therapeutic Moisturizing Lotion with Alpha Hydroxy  Curel Extreme Care Body Lotion  Curel Soothing Hands Moisturizing Hand Lotion  Curel Therapeutic Moisturizing Cream, Fragrance-Free  Curel Therapeutic Moisturizing Lotion, Fragrance-Free  Curel Therapeutic Moisturizing Lotion, Original Formula  Eucerin Daily Replenishing Lotion  Eucerin Dry Skin Therapy Plus Alpha Hydroxy Crme  Eucerin Dry Skin Therapy Plus Alpha Hydroxy Lotion  Eucerin Original Crme  Eucerin Original Lotion  Eucerin Plus Crme Eucerin Plus Lotion  Eucerin TriLipid Replenishing Lotion  Keri  Anti-Bacterial Hand Lotion  Keri Deep Conditioning Original Lotion Dry Skin Formula Softly Scented  Keri Deep Conditioning Original Lotion, Fragrance Free Sensitive Skin Formula  Keri Lotion Fast Absorbing Fragrance Free Sensitive Skin Formula  Keri Lotion Fast Absorbing Softly Scented Dry Skin Formula  Keri Original Lotion  Keri Skin Renewal Lotion Keri Silky Smooth Lotion  Keri Silky Smooth Sensitive Skin Lotion  Nivea Body Creamy Conditioning Oil  Nivea Body Extra Enriched Teacher, adult education Moisturizing Lotion Nivea Crme  Nivea Skin Firming Lotion  NutraDerm 30 Skin Lotion  NutraDerm Skin Lotion  NutraDerm Therapeutic Skin Cream  NutraDerm Therapeutic Skin Lotion  ProShield Protective Hand Cream  Provon moisturizing lotion

## 2023-08-20 NOTE — Progress Notes (Addendum)
 Anesthesia Review:  PCP: Rex Castor   clearance in media tab dated 03/06/23.  Cardiologist : Zoe Hinds LOV 09/11/22   PPM/ ICD: Device Orders: Rep Notified:  Chest x-ray : EKG : Monitor- 2020 CT Cors- 2020  Echo : Stress test: Cardiac Cath :   Activity level:  Sleep Study/ CPAP : Fasting Blood Sugar :      / Checks Blood Sugar -- times a day:     DM- type2- occ check  Hgba1c-  07/30/23-5.9  Actos-none am of surgery   Blood Thinner/ Instructions /Last Dose: ASA / Instructions/ Last Dose :    08/06/23- surgery cancelled due to spider bite and placed on antibitics .  Spider bite on left leg per pt .  Could not have surgery due to being on antibiotics.  Seen by PCP.  Requested OV note  They are to fax. OV note dated 07/23/23 in Media tab dated 08/23/23.     OR schedule states right knee.  PT states right knee.  Consent order states left knee. Called and LVMM for Surgery Scheduler. On 08/23/23.

## 2023-08-23 ENCOUNTER — Encounter (HOSPITAL_COMMUNITY): Payer: Self-pay

## 2023-08-23 ENCOUNTER — Encounter (HOSPITAL_COMMUNITY)
Admission: RE | Admit: 2023-08-23 | Discharge: 2023-08-23 | Disposition: A | Discharge status: Home / Self Care | Source: Ambulatory Visit | Attending: Orthopedic Surgery | Admitting: Orthopedic Surgery

## 2023-08-23 ENCOUNTER — Other Ambulatory Visit: Payer: Self-pay

## 2023-08-23 VITALS — BP 127/77 | HR 56 | Temp 98.2°F | Resp 16 | Ht 71.0 in | Wt 204.0 lb

## 2023-08-23 DIAGNOSIS — I2510 Atherosclerotic heart disease of native coronary artery without angina pectoris: Secondary | ICD-10-CM | POA: Insufficient documentation

## 2023-08-23 DIAGNOSIS — Z8546 Personal history of malignant neoplasm of prostate: Secondary | ICD-10-CM | POA: Diagnosis not present

## 2023-08-23 DIAGNOSIS — Z01818 Encounter for other preprocedural examination: Secondary | ICD-10-CM | POA: Diagnosis present

## 2023-08-23 DIAGNOSIS — Z87891 Personal history of nicotine dependence: Secondary | ICD-10-CM | POA: Insufficient documentation

## 2023-08-23 DIAGNOSIS — E119 Type 2 diabetes mellitus without complications: Secondary | ICD-10-CM | POA: Insufficient documentation

## 2023-08-23 DIAGNOSIS — Q245 Malformation of coronary vessels: Secondary | ICD-10-CM | POA: Insufficient documentation

## 2023-08-23 DIAGNOSIS — M1711 Unilateral primary osteoarthritis, right knee: Secondary | ICD-10-CM | POA: Insufficient documentation

## 2023-08-23 LAB — BASIC METABOLIC PANEL WITH GFR
Anion gap: 6 (ref 5–15)
BUN: 19 mg/dL (ref 8–23)
CO2: 24 mmol/L (ref 22–32)
Calcium: 8.9 mg/dL (ref 8.9–10.3)
Chloride: 109 mmol/L (ref 98–111)
Creatinine, Ser: 1 mg/dL (ref 0.61–1.24)
GFR, Estimated: >60 mL/min (ref >60)
Glucose, Bld: 114 mg/dL — ABNORMAL HIGH (ref 70–99)
Potassium: 4.3 mmol/L (ref 3.5–5.1)
Sodium: 139 mmol/L (ref 135–145)

## 2023-08-23 LAB — CBC
HCT: 38.9 % — ABNORMAL LOW (ref 39.0–52.0)
Hemoglobin: 12.9 g/dL — ABNORMAL LOW (ref 13.0–17.0)
MCH: 32.7 pg (ref 26.0–34.0)
MCHC: 33.2 g/dL (ref 30.0–36.0)
MCV: 98.5 fL (ref 80.0–100.0)
Platelets: 155 10*3/uL (ref 150–400)
RBC: 3.95 MIL/uL — ABNORMAL LOW (ref 4.22–5.81)
RDW: 12.9 % (ref 11.5–15.5)
WBC: 4.3 10*3/uL (ref 4.0–10.5)
nRBC: 0 % (ref 0.0–0.2)

## 2023-08-23 LAB — SURGICAL PCR SCREEN
MRSA, PCR: NEGATIVE
Staphylococcus aureus: NEGATIVE

## 2023-08-23 LAB — GLUCOSE, CAPILLARY: Glucose-Capillary: 105 mg/dL — ABNORMAL HIGH (ref 70–99)

## 2023-08-26 ENCOUNTER — Encounter (HOSPITAL_COMMUNITY): Payer: Self-pay

## 2023-08-26 NOTE — Anesthesia Preprocedure Evaluation (Addendum)
 Anesthesia Evaluation  Patient identified by MRN, date of birth, ID band Patient awake    Reviewed: Allergy & Precautions, NPO status , Patient's Chart, lab work & pertinent test results  History of Anesthesia Complications Negative for: history of anesthetic complications  Airway Mallampati: II  TM Distance: >3 FB Neck ROM: Full    Dental  (+) Dental Advisory Given   Pulmonary COPD,  COPD inhaler, former smoker   breath sounds clear to auscultation       Cardiovascular (-) angina + CAD (mild, non-obstructive)   Rhythm:Regular Rate:Normal     Neuro/Psych negative neurological ROS     GI/Hepatic negative GI ROS, Neg liver ROS,,,  Endo/Other  diabetes (glu 116), Oral Hypoglycemic Agents    Renal/GU negative Renal ROS     Musculoskeletal  (+) Arthritis ,    Abdominal   Peds  Hematology Hb 12.9, plt 155k   Anesthesia Other Findings Prostate cancer  Reproductive/Obstetrics                             Anesthesia Physical Anesthesia Plan  ASA: 3  Anesthesia Plan: Spinal   Post-op Pain Management: Tylenol PO (pre-op)* and Regional block*   Induction:   PONV Risk Score and Plan: 1 and Treatment may vary due to age or medical condition and Ondansetron   Airway Management Planned: Natural Airway and Simple Face Mask  Additional Equipment: None  Intra-op Plan:   Post-operative Plan:   Informed Consent: I have reviewed the patients History and Physical, chart, labs and discussed the procedure including the risks, benefits and alternatives for the proposed anesthesia with the patient or authorized representative who has indicated his/her understanding and acceptance.     Dental advisory given  Plan Discussed with: CRNA and Surgeon  Anesthesia Plan Comments: (See PAT note from 5/9 Plan routine monitors, SAB with adductor canal block for post op analgesia)        Anesthesia  Quick Evaluation

## 2023-08-26 NOTE — Progress Notes (Signed)
 Case: [id number] Date/Time: 09/03/23 1345   Procedure: ARTHROPLASTY, KNEE, TOTAL (Right: Knee)   Anesthesia type: Spinal   Pre-op diagnosis: Right knee osteoarthritis   [facility]: [facility] ROOM 10 / [facility]   Surgeons: Claiborne Crew, MD       DISCUSSION: Steven Bishop is a 78 yo male who presents to PAT prior to surgery above. PMH of former smoking, CAD, DM, prostate cancer, arthritis.  Prior anesthesia complication includes prolonged emergence.  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 Last seen on 09/11/22 by Dr. Sandee Crook. He is on medical therapy and has no symptoms. Advised to f/u in 1 year at that time.  Pt follows with PCP. Last seen on 07/23/23 for a spider bite and was treated with antibiotics. He followed up on 08/05/23 and it was noted that he was improving. Medical clearance signed (scanned in media on 07/30/23).  VS: BP 127/77   Pulse (!) 56   Temp 36.8 C (Oral)   Resp 16   Ht 5\' 11"  (1.803 m)   Wt 92.5 kg   SpO2 99%   BMI 28.45 kg/m   PROVIDERS: Lucius Sabins., MD   LABS: Labs reviewed: Acceptable for surgery. (all labs ordered are listed, but only abnormal results are displayed)  Labs Reviewed  CBC - Abnormal; Notable for the following components:      Result Value   RBC 3.95 (*)    Hemoglobin 12.9 (*)    HCT 38.9 (*)    All other components within normal limits  BASIC METABOLIC PANEL WITH GFR - Abnormal; Notable for the following components:   Glucose, Bld 114 (*)    All other components within normal limits  GLUCOSE, CAPILLARY - Abnormal; Notable for the following components:   Glucose-Capillary 105 (*)    All other components within normal limits  SURGICAL PCR SCREEN     IMAGES:   EKG 08/23/23:   Sinus bradycardia, rate 57 When compared with ECG of 11-Jan-2015 15:36, No significant change since last tracing  CV:  Cardiac monitor 01/21/2019:  An event monitor was performed for 14 days beginning 12/25/2018 in order to assess syncope.    The rhythm throughout is sinus with minimum average and maximum heart rates of 45, 64 and 107 bpm.  The minimum rate is sinus bradycardia.   There were no pauses of 3 seconds or greater and no episodes of AV node or sinus node block.   Ventricular ectopy was rare with isolated PVCs couplets and bigeminy 7.7 seconds trigeminy 6.3 seconds.   Supraventricular arrhythmia was rare with APCs.  There were 13 brief runs of atrial premature contractions the longest 15 complexes at a rate of 109 bpm, atrial tachycardia.  The fastest 4 beats at a rate of 179 bpm.  There are no episodes of atrial fibrillation or flutter.   There were 2 diary events 1 with palpitation associated with infrequent PVC There were 5 triggered events 1 associated with infrequent PVC     Conclusion unremarkable 14-day ZIO monitor to assess syncope, no significant bradycardia seen.  There is rare supraventricular and ventricular ectopy present and the triggered and diary events are predominantly unassociated with PVCs  CTA coronary 01/13/2019:  IMPRESSION: 1. 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   2. Coronary calcium  score of 141. This was 45 percentile for age and sex matched control.   3. Normal coronary origin with right dominance.   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   Past  Medical History:  Diagnosis Date   Arthritis    Chest pain 12/18/2018   Complication of anesthesia    " It takes a while for me to wake up."   Diabetes mellitus (HCC)    Distal radius fracture, right    Pneumonia    hx of 11/24   Prostate cancer [facility])     Past Surgical History:  Procedure Laterality Date   CERVICAL SPINE SURGERY     ORIF RADIAL FRACTURE Right 01/11/2015   Procedure: OPEN REDUCTION INTERNAL FIXATION (ORIF) RIGHT RADIAL FRACTURE WITH REPAIR AND RECONSTRUCTION;  Surgeon: Ronn Cohn, MD;  Location: [facility] OR;  Service: Orthopedics;  Laterality: Right;   PROSTATECTOMY     TONSILLECTOMY AND ADENOIDECTOMY       MEDICATIONS:  albuterol (VENTOLIN HFA) 108 (90 Base) MCG/ACT inhaler   Ascorbic Acid (VITAMIN C) 1000 MG tablet   aspirin EC 81 MG tablet   Calcium  Carb-Cholecalciferol (CALCIUM  600 + D PO)   Cholecalciferol (VITAMIN D3) 50 MCG (2000 UT) capsule   KRILL OIL PO   Lutein 20 MG CAPS   nitroGLYCERIN  (NITROSTAT ) 0.4 MG SL tablet   Omega-3 Fatty Acids (FISH OIL PO)   pioglitazone (ACTOS) 30 MG tablet   rosuvastatin  (CRESTOR ) 5 MG tablet   TURMERIC PO   vitamin E 180 MG (400 UNITS) capsule   No current facility-administered medications for this encounter.   [name] [facility] Surgical Short Stay/Anesthesiology [facility] Phone [phone] 08/26/2023 1:29 PM

## 2023-08-27 DIAGNOSIS — M1711 Unilateral primary osteoarthritis, right knee: Secondary | ICD-10-CM | POA: Insufficient documentation

## 2023-09-03 ENCOUNTER — Other Ambulatory Visit: Payer: Self-pay

## 2023-09-03 ENCOUNTER — Encounter (HOSPITAL_COMMUNITY): Payer: Self-pay | Admitting: Orthopedic Surgery

## 2023-09-03 ENCOUNTER — Ambulatory Visit (HOSPITAL_COMMUNITY): Payer: Self-pay | Admitting: Medical

## 2023-09-03 ENCOUNTER — Ambulatory Visit (HOSPITAL_COMMUNITY): Payer: Self-pay | Admitting: Anesthesiology

## 2023-09-03 ENCOUNTER — Ambulatory Visit (HOSPITAL_COMMUNITY)
Admission: RE | Admit: 2023-09-03 | Discharge: 2023-09-03 | Disposition: A | Discharge status: Home / Self Care | Source: Ambulatory Visit | Attending: Orthopedic Surgery | Admitting: Orthopedic Surgery

## 2023-09-03 ENCOUNTER — Encounter (HOSPITAL_COMMUNITY)
Admission: RE | Disposition: A | Discharge status: Home / Self Care | Payer: Self-pay | Source: Ambulatory Visit | Attending: Orthopedic Surgery

## 2023-09-03 DIAGNOSIS — I2510 Atherosclerotic heart disease of native coronary artery without angina pectoris: Secondary | ICD-10-CM | POA: Diagnosis not present

## 2023-09-03 DIAGNOSIS — Z7984 Long term (current) use of oral hypoglycemic drugs: Secondary | ICD-10-CM | POA: Insufficient documentation

## 2023-09-03 DIAGNOSIS — J449 Chronic obstructive pulmonary disease, unspecified: Secondary | ICD-10-CM

## 2023-09-03 DIAGNOSIS — M25761 Osteophyte, right knee: Secondary | ICD-10-CM | POA: Diagnosis not present

## 2023-09-03 DIAGNOSIS — Z9079 Acquired absence of other genital organ(s): Secondary | ICD-10-CM | POA: Insufficient documentation

## 2023-09-03 DIAGNOSIS — M1711 Unilateral primary osteoarthritis, right knee: Secondary | ICD-10-CM | POA: Insufficient documentation

## 2023-09-03 DIAGNOSIS — Z96651 Presence of right artificial knee joint: Secondary | ICD-10-CM

## 2023-09-03 DIAGNOSIS — Z01818 Encounter for other preprocedural examination: Secondary | ICD-10-CM

## 2023-09-03 DIAGNOSIS — E119 Type 2 diabetes mellitus without complications: Secondary | ICD-10-CM | POA: Diagnosis not present

## 2023-09-03 DIAGNOSIS — Z87891 Personal history of nicotine dependence: Secondary | ICD-10-CM | POA: Insufficient documentation

## 2023-09-03 DIAGNOSIS — Z8546 Personal history of malignant neoplasm of prostate: Secondary | ICD-10-CM | POA: Insufficient documentation

## 2023-09-03 HISTORY — PX: TOTAL KNEE ARTHROPLASTY: SHX125

## 2023-09-03 HISTORY — DX: Presence of right artificial knee joint: Z96.651

## 2023-09-03 LAB — GLUCOSE, CAPILLARY
Glucose-Capillary: 116 mg/dL — ABNORMAL HIGH (ref 70–99)
Glucose-Capillary: 123 mg/dL — ABNORMAL HIGH (ref 70–99)

## 2023-09-03 SURGERY — ARTHROPLASTY, KNEE, TOTAL
Anesthesia: Regional | Site: Knee | Laterality: Right

## 2023-09-03 MED ORDER — FENTANYL CITRATE PF 50 MCG/ML IJ SOSY
100.0000 ug | PREFILLED_SYRINGE | INTRAMUSCULAR | Status: AC
  Administered 2023-09-03: 50 ug via INTRAVENOUS
  Filled 2023-09-03: qty 2

## 2023-09-03 MED ORDER — STERILE WATER FOR IRRIGATION IR SOLN
Status: DC | PRN
  Administered 2023-09-03: 1000 mL

## 2023-09-03 MED ORDER — DEXAMETHASONE SODIUM PHOSPHATE 10 MG/ML IJ SOLN
8.0000 mg | Freq: Once | INTRAMUSCULAR | Status: AC
  Administered 2023-09-03: 8 mg via INTRAVENOUS

## 2023-09-03 MED ORDER — OXYCODONE HCL 5 MG PO TABS: 5.0000 mg | ORAL_TABLET | Freq: Once | ORAL | Status: DC | PRN

## 2023-09-03 MED ORDER — KETOROLAC TROMETHAMINE 30 MG/ML IJ SOLN
INTRAMUSCULAR | Status: AC
  Filled 2023-09-03: qty 1

## 2023-09-03 MED ORDER — HYDROMORPHONE HCL 1 MG/ML IJ SOLN: 0.5000 mg | INTRAMUSCULAR | Status: DC | PRN

## 2023-09-03 MED ORDER — CEFAZOLIN SODIUM-DEXTROSE 2-4 GM/100ML-% IV SOLN
INTRAVENOUS | Status: AC
  Filled 2023-09-03: qty 100

## 2023-09-03 MED ORDER — METOCLOPRAMIDE HCL 5 MG PO TABS: 5.0000 mg | ORAL_TABLET | Freq: Three times a day (TID) | ORAL | Status: DC | PRN

## 2023-09-03 MED ORDER — ACETAMINOPHEN 500 MG PO TABS
1000.0000 mg | ORAL_TABLET | Freq: Once | ORAL | Status: AC
  Administered 2023-09-03: 1000 mg via ORAL
  Filled 2023-09-03: qty 2

## 2023-09-03 MED ORDER — INSULIN ASPART 100 UNIT/ML IJ SOLN: 0.0000 [IU] | INTRAMUSCULAR | Status: DC | PRN

## 2023-09-03 MED ORDER — SODIUM CHLORIDE (PF) 0.9 % IJ SOLN
INTRAMUSCULAR | Status: AC
Start: 2023-09-03 — End: ?
  Filled 2023-09-03: qty 30

## 2023-09-03 MED ORDER — SODIUM CHLORIDE (PF) 0.9 % IJ SOLN
INTRAMUSCULAR | Status: DC | PRN
  Administered 2023-09-03: 61 mL

## 2023-09-03 MED ORDER — PHENYLEPHRINE HCL-NACL 20-0.9 MG/250ML-% IV SOLN
INTRAVENOUS | Status: DC | PRN
  Administered 2023-09-03: 30 ug/min via INTRAVENOUS

## 2023-09-03 MED ORDER — MEPERIDINE HCL 50 MG/ML IJ SOLN: 6.2500 mg | INTRAMUSCULAR | Status: DC | PRN

## 2023-09-03 MED ORDER — POVIDONE-IODINE 10 % EX SWAB
2.0000 | Freq: Once | CUTANEOUS | Status: DC
Start: 2023-09-03 — End: 2023-09-03

## 2023-09-03 MED ORDER — TRANEXAMIC ACID-NACL 1000-0.7 MG/100ML-% IV SOLN
1000.0000 mg | INTRAVENOUS | Status: AC
  Administered 2023-09-03: 1000 mg via INTRAVENOUS
  Filled 2023-09-03: qty 100

## 2023-09-03 MED ORDER — METOCLOPRAMIDE HCL 5 MG/ML IJ SOLN: 5.0000 mg | Freq: Three times a day (TID) | INTRAMUSCULAR | Status: DC | PRN

## 2023-09-03 MED ORDER — PROPOFOL 1000 MG/100ML IV EMUL
INTRAVENOUS | Status: AC
  Filled 2023-09-03: qty 100

## 2023-09-03 MED ORDER — DEXAMETHASONE SODIUM PHOSPHATE 10 MG/ML IJ SOLN
INTRAMUSCULAR | Status: AC
  Filled 2023-09-03: qty 1

## 2023-09-03 MED ORDER — ACETAMINOPHEN 500 MG PO TABS: 1000.0000 mg | ORAL_TABLET | Freq: Four times a day (QID) | ORAL | Status: DC

## 2023-09-03 MED ORDER — OXYCODONE HCL 5 MG/5ML PO SOLN: 5.0000 mg | Freq: Once | ORAL | Status: DC | PRN

## 2023-09-03 MED ORDER — METHOCARBAMOL 1000 MG/10ML IJ SOLN: 500.0000 mg | Freq: Four times a day (QID) | INTRAMUSCULAR | Status: DC | PRN

## 2023-09-03 MED ORDER — SODIUM CHLORIDE 0.9 % IR SOLN
Status: DC | PRN
  Administered 2023-09-03: 1000 mL

## 2023-09-03 MED ORDER — ORAL CARE MOUTH RINSE: 15.0000 mL | Freq: Once | OROMUCOSAL | Status: AC

## 2023-09-03 MED ORDER — TRANEXAMIC ACID-NACL 1000-0.7 MG/100ML-% IV SOLN
INTRAVENOUS | Status: AC
  Filled 2023-09-03: qty 100

## 2023-09-03 MED ORDER — BUPIVACAINE-EPINEPHRINE (PF) 0.25% -1:200000 IJ SOLN
INTRAMUSCULAR | Status: AC
  Filled 2023-09-03: qty 30

## 2023-09-03 MED ORDER — LACTATED RINGERS IV SOLN: INTRAVENOUS | Status: DC

## 2023-09-03 MED ORDER — 0.9 % SODIUM CHLORIDE (POUR BTL) OPTIME
TOPICAL | Status: DC | PRN
  Administered 2023-09-03: 1000 mL

## 2023-09-03 MED ORDER — CEFAZOLIN SODIUM-DEXTROSE 2-4 GM/100ML-% IV SOLN
2.0000 g | INTRAVENOUS | Status: AC
  Administered 2023-09-03: 2 g via INTRAVENOUS
  Filled 2023-09-03: qty 100

## 2023-09-03 MED ORDER — PROPOFOL 500 MG/50ML IV EMUL
INTRAVENOUS | Status: DC | PRN
  Administered 2023-09-03: 50 ug/kg/min via INTRAVENOUS
  Administered 2023-09-03: 20 mg via INTRAVENOUS

## 2023-09-03 MED ORDER — MIDAZOLAM HCL 2 MG/2ML IJ SOLN: 0.5000 mg | Freq: Once | INTRAMUSCULAR | Status: DC | PRN

## 2023-09-03 MED ORDER — ROPIVACAINE HCL 7.5 MG/ML IJ SOLN
INTRAMUSCULAR | Status: DC | PRN
  Administered 2023-09-03: 20 mL via PERINEURAL

## 2023-09-03 MED ORDER — BUPIVACAINE IN DEXTROSE 0.75-8.25 % IT SOLN
INTRATHECAL | Status: DC | PRN
Start: 2023-09-03 — End: 2023-09-03
  Administered 2023-09-03: 13.5 mg via INTRATHECAL

## 2023-09-03 MED ORDER — OXYCODONE HCL 5 MG PO TABS: 10.0000 mg | ORAL_TABLET | ORAL | Status: DC | PRN

## 2023-09-03 MED ORDER — CEFAZOLIN SODIUM-DEXTROSE 2-4 GM/100ML-% IV SOLN
2.0000 g | Freq: Four times a day (QID) | INTRAVENOUS | Status: DC
  Administered 2023-09-03: 2 g via INTRAVENOUS

## 2023-09-03 MED ORDER — HYDROMORPHONE HCL 1 MG/ML IJ SOLN: 0.2500 mg | INTRAMUSCULAR | Status: DC | PRN

## 2023-09-03 MED ORDER — MIDAZOLAM HCL 2 MG/2ML IJ SOLN
2.0000 mg | INTRAMUSCULAR | Status: AC
  Administered 2023-09-03: 1 mg via INTRAVENOUS
  Filled 2023-09-03: qty 2

## 2023-09-03 MED ORDER — ONDANSETRON HCL 4 MG/2ML IJ SOLN: 4.0000 mg | Freq: Four times a day (QID) | INTRAMUSCULAR | Status: DC | PRN

## 2023-09-03 MED ORDER — METHOCARBAMOL 500 MG PO TABS: 500.0000 mg | ORAL_TABLET | Freq: Four times a day (QID) | ORAL | Status: DC | PRN

## 2023-09-03 MED ORDER — ONDANSETRON HCL 4 MG/2ML IJ SOLN
INTRAMUSCULAR | Status: DC | PRN
  Administered 2023-09-03: 4 mg via INTRAVENOUS

## 2023-09-03 MED ORDER — OXYCODONE HCL 5 MG PO TABS: 5.0000 mg | ORAL_TABLET | ORAL | Status: DC | PRN

## 2023-09-03 MED ORDER — ONDANSETRON HCL 4 MG PO TABS: 4.0000 mg | ORAL_TABLET | Freq: Four times a day (QID) | ORAL | Status: DC | PRN

## 2023-09-03 MED ORDER — CHLORHEXIDINE GLUCONATE 0.12 % MT SOLN
15.0000 mL | Freq: Once | OROMUCOSAL | Status: AC
  Administered 2023-09-03: 15 mL via OROMUCOSAL

## 2023-09-03 MED ORDER — TRANEXAMIC ACID-NACL 1000-0.7 MG/100ML-% IV SOLN
1000.0000 mg | Freq: Once | INTRAVENOUS | Status: AC
  Administered 2023-09-03: 1000 mg via INTRAVENOUS

## 2023-09-03 MED ORDER — ONDANSETRON HCL 4 MG/2ML IJ SOLN
INTRAMUSCULAR | Status: AC
  Filled 2023-09-03: qty 2

## 2023-09-03 SURGICAL SUPPLY — 44 items
ATTUNE MED ANAT PAT 38 KNEE (Knees) IMPLANT
BAG COUNTER SPONGE SURGICOUNT (BAG) IMPLANT
BAG ZIPLOCK 12X15 (MISCELLANEOUS) ×2 IMPLANT
BASEPLATE TIB CMT FB PCKT SZ 8 (Knees) IMPLANT
BLADE SAW SGTL 13.0X1.19X90.0M (BLADE) ×2 IMPLANT
BNDG ELASTIC 6INX 5YD STR LF (GAUZE/BANDAGES/DRESSINGS) ×2 IMPLANT
BOWL SMART MIX CTS (DISPOSABLE) ×2 IMPLANT
CEMENT HV SMART SET (Cement) ×4 IMPLANT
COMPONENT FEM CMT ATTN KN 7 RT (Joint) IMPLANT
COVER SURGICAL LIGHT HANDLE (MISCELLANEOUS) ×2 IMPLANT
CUFF TRNQT CYL 34X4.125X (TOURNIQUET CUFF) ×2 IMPLANT
DERMABOND ADVANCED .7 DNX12 (GAUZE/BANDAGES/DRESSINGS) ×2 IMPLANT
DRAPE U-SHAPE 47X51 STRL (DRAPES) ×2 IMPLANT
DRESSING AQUACEL AG SP 3.5X10 (GAUZE/BANDAGES/DRESSINGS) ×2 IMPLANT
DURAPREP 26ML APPLICATOR (WOUND CARE) ×4 IMPLANT
ELECT REM PT RETURN 15FT ADLT (MISCELLANEOUS) ×2 IMPLANT
GLOVE BIO SURGEON STRL SZ 6 (GLOVE) ×2 IMPLANT
GLOVE BIOGEL PI IND STRL 6.5 (GLOVE) ×2 IMPLANT
GLOVE BIOGEL PI IND STRL 7.5 (GLOVE) ×2 IMPLANT
GLOVE ORTHO TXT STRL SZ7.5 (GLOVE) ×4 IMPLANT
GOWN STRL REUS W/ TWL LRG LVL3 (GOWN DISPOSABLE) ×4 IMPLANT
HOLDER FOLEY CATH W/STRAP (MISCELLANEOUS) IMPLANT
INSERT MED CMT ATTUNE 7 6 RT (Insert) IMPLANT
KIT TURNOVER KIT A (KITS) ×2 IMPLANT
MANIFOLD NEPTUNE II (INSTRUMENTS) ×2 IMPLANT
NDL SAFETY ECLIPSE 18X1.5 (NEEDLE) IMPLANT
NS IRRIG 1000ML POUR BTL (IV SOLUTION) ×2 IMPLANT
PACK TOTAL KNEE CUSTOM (KITS) ×2 IMPLANT
PENCIL SMOKE EVACUATOR (MISCELLANEOUS) ×2 IMPLANT
PIN FIX SIGMA LCS THRD HI (PIN) IMPLANT
PROTECTOR NERVE ULNAR (MISCELLANEOUS) ×2 IMPLANT
SET HNDPC FAN SPRY TIP SCT (DISPOSABLE) ×2 IMPLANT
SET PAD KNEE POSITIONER (MISCELLANEOUS) ×2 IMPLANT
SPIKE FLUID TRANSFER (MISCELLANEOUS) ×4 IMPLANT
SUT MNCRL AB 4-0 PS2 18 (SUTURE) ×2 IMPLANT
SUT STRATAFIX PDS+ 0 24IN (SUTURE) ×2 IMPLANT
SUT VIC AB 1 CT1 36 (SUTURE) ×2 IMPLANT
SUT VIC AB 2-0 CT1 TAPERPNT 27 (SUTURE) ×4 IMPLANT
SYR 3ML LL SCALE MARK (SYRINGE) ×2 IMPLANT
TOWEL GREEN STERILE FF (TOWEL DISPOSABLE) ×2 IMPLANT
TRAY FOLEY MTR SLVR 16FR STAT (SET/KITS/TRAYS/PACK) ×2 IMPLANT
TUBE SUCTION HIGH CAP CLEAR NV (SUCTIONS) ×2 IMPLANT
WATER STERILE IRR 1000ML POUR (IV SOLUTION) ×4 IMPLANT
WRAP KNEE MAXI GEL POST OP (GAUZE/BANDAGES/DRESSINGS) ×2 IMPLANT

## 2023-09-03 NOTE — Anesthesia Procedure Notes (Signed)
 Anesthesia Regional Block: Adductor canal block   Pre-Anesthetic Checklist: , timeout performed,  Correct Patient, Correct Site, Correct Laterality,  Correct Procedure, Correct Position, site marked,  Risks and benefits discussed,  Surgical consent,  Pre-op evaluation,  At surgeon's request and post-op pain management  Laterality: Right and Lower  Prep: chloraprep       Needles:  Injection technique: Single-shot  Needle Type: Echogenic Needle     Needle Length: 9cm  Needle Gauge: 21     Additional Needles:   Procedures:,,,, ultrasound used (permanent image in chart),,    Narrative:  Start time: 09/03/2023 9:17 AM End time: 09/03/2023 9:23 AM Injection made incrementally with aspirations every 5 mL.  Performed by: Personally  Anesthesiologist: Jonne Netters, MD  Additional Notes: Pt identified in Holding room.  Monitors applied. Working IV access confirmed. Timeout, Sterile prep R thigh.  #21ga ECHOgenic Arrow block needle into adductor canal with US  guidance.  20cc 0.75% Ropivacaine injected incrementally after negative test dose.  Patient asymptomatic, VSS, no heme aspirated, tolerated well.   [name], MD

## 2023-09-03 NOTE — Interval H&P Note (Signed)
 History and Physical Interval Note:  09/03/2023 8:39 AM  Steven Bishop  has presented today for surgery, with the diagnosis of Right knee osteoarthritis.  The various methods of treatment have been discussed with the patient and family. After consideration of risks, benefits and other options for treatment, the patient has consented to  Procedure(s): ARTHROPLASTY, KNEE, TOTAL (Right) as a surgical intervention.  The patient's history has been reviewed, patient examined, no change in status, stable for surgery.  I have reviewed the patient's chart and labs.  Questions were answered to the patient's satisfaction.     [name]

## 2023-09-03 NOTE — Evaluation (Signed)
 Physical Therapy Evaluation Patient Details Name: Steven Bishop MRN: [id number] DOB: 17-Oct-1945 Today's Date: 09/03/2023  History of Present Illness  78 yo male presents to therapy s/p R TKA on 09/03/2023 due to failure of conservative measures. Pt PMH includes but is not limited to: prostate ca, DM II, angina, cervical spine surgery and R distal raidal fx with ORIF.  Clinical Impression      KOSTON HENNES is a 78 y.o. male POD 0 s/p R TKA. Patient reports IND with mobility at baseline. Patient is now limited by functional impairments (see PT problem list below) and requires CGA and cues for transfers and gait with RW. Patient was able to ambulate 80 feet with RW and CGA and cues for safe walker management. Patient educated on safe sequencing for stair mobility with use of RW (HO provided), fall risk prevention, safety, pain management, use of CP/ice and car transfers pt and spouse verbalized understanding of safe guarding position for people assisting with mobility. Patient instructed in exercises to facilitate ROM and circulation reviewed and HO provided. Patient will benefit from continued skilled PT interventions to address impairments and progress towards PLOF. Patient has met mobility goals at adequate level for discharge home with family support and OPPT service; will continue to follow if pt continues acute stay to progress towards Mod I goals.     If plan is discharge home, recommend the following: A little help with walking and/or transfers;A little help with bathing/dressing/bathroom;Assistance with cooking/housework;Assist for transportation;Help with stairs or ramp for entrance   Can travel by private vehicle        Equipment Recommendations Rolling walker (2 wheels)  Recommendations for Other Services       Functional Status Assessment Patient has had a recent decline in their functional status and demonstrates the ability to make significant improvements in function in a  reasonable and predictable amount of time.     Precautions / Restrictions Precautions Precautions: Knee;Fall Restrictions Weight Bearing Restrictions Per Provider Order: No      Mobility  Bed Mobility Overal bed mobility: Needs Assistance Bed Mobility: Supine to Sit     Supine to sit: Supervision     General bed mobility comments: min cues    Transfers Overall transfer level: Needs assistance Equipment used: Rolling walker (2 wheels) Transfers: Sit to/from Stand Sit to Stand: Contact guard assist           General transfer comment: min cues for safety, proper UE and AD placement    Ambulation/Gait Ambulation/Gait assistance: Contact guard assist Gait Distance (Feet): 80 Feet Assistive device: Rolling walker (2 wheels) Gait Pattern/deviations: Step-to pattern, Antalgic, Decreased stance time - right, Trunk flexed Gait velocity: decreased     General Gait Details: slight trunk flexion and B UE support at RW to offload R LE in stance phase, step to pattern with cues for RW management noted limited R knee and dorsi flexion  Stairs Stairs: Yes Stairs assistance: Contact guard assist Stair Management: With walker, Two rails Number of Stairs: 2 General stair comments: step navigation initiated with B handrails and cues for safety and sequencing, use of RW and retrograde stepping pattern with use of RW due to no handrails in home setting, spouse present and provided with HO  Wheelchair Mobility     Tilt Bed    Modified Rankin (Stroke Patients Only)       Balance Overall balance assessment: Needs assistance Sitting-balance support: Feet supported Sitting balance-Leahy Scale: Good  Standing balance support: Bilateral upper extremity supported, During functional activity, Reliant on assistive device for balance Standing balance-Leahy Scale: Poor                               Pertinent Vitals/Pain Pain Assessment Pain Assessment:  0-10 Pain Score: 3  Pain Location: R knee Pain Descriptors / Indicators: Constant, Aching, Discomfort Pain Intervention(s): Limited activity within patient's tolerance, Monitored during session, Premedicated before session, Repositioned, Ice applied    Home Living Family/patient expects to be discharged to:: Private residence Living Arrangements: Spouse/significant other Available Help at Discharge: Family Type of Home: House Home Access: Stairs to enter Entrance Stairs-Rails: None Entrance Stairs-Number of Steps: 2 Alternate Level Stairs-Number of Steps: flight Home Layout: Multi-level;Laundry or work area in basement;Able to live on main level with [employer]: None      Prior Function Prior Level of Function : Independent/Modified Independent;Driving             Mobility Comments: IND for all ADLs self care tasks and IADLs, no AD       Extremity/Trunk Assessment   Upper Extremity Assessment Upper Extremity Assessment: Defer to OT evaluation    Lower Extremity Assessment Lower Extremity Assessment: RLE deficits/detail RLE Deficits / Details: ankle DF/PF 5/5; SLR < 10 degree lag RLE Sensation: decreased proprioception;decreased light touch (pt reports distal portion of R LE remains "numb")    Cervical / Trunk Assessment Cervical / Trunk Assessment: Back Surgery;Neck Surgery  Communication   Communication Communication: No apparent difficulties    Cognition Arousal: Alert Behavior During Therapy: WFL for tasks assessed/performed   PT - Cognitive impairments: No apparent impairments                       PT - Cognition Comments: pt slightly impusive and requried cues for safety and slow controlled movements Following commands: Intact       Cueing       General Comments      Exercises Total Joint Exercises Ankle Circles/Pumps: AROM, Both, 10 reps Quad Sets: AROM, Right, 5 reps Short Arc Quad: AROM, Right, 5 reps Heel  Slides: AROM, Right, 5 reps Hip ABduction/ADduction: AROM, Right, 5 reps Straight Leg Raises: AROM, Right, 5 reps Knee Flexion: AROM, Right, 5 reps   Assessment/Plan    PT Assessment Patient needs continued PT services  PT Problem List Decreased strength;Decreased range of motion;Decreased activity tolerance;Decreased balance;Decreased mobility;Pain       PT Treatment Interventions DME instruction;Gait training;Stair training;Functional mobility training;Therapeutic activities;Therapeutic exercise;Balance training;Neuromuscular re-education;Patient/family education;Modalities    PT Goals (Current goals can be found in the Care Plan section)  Acute Rehab PT Goals Patient Stated Goal: to play golf by thursday pm PT Goal Formulation: With patient Time For Goal Achievement: 09/17/23 Potential to Achieve Goals: Good    Frequency 7X/week     Co-evaluation               AM-PAC PT "6 Clicks" Mobility  Outcome Measure Help needed turning from your back to your side while in a flat bed without using bedrails?: None Help needed moving from lying on your back to sitting on the side of a flat bed without using bedrails?: A Little Help needed moving to and from a bed to a chair (including a wheelchair)?: A Little Help needed standing up from a chair using your arms (e.g., wheelchair or bedside chair)?: A Little Help  needed to walk in hospital room?: A Little Help needed climbing 3-5 steps with a railing? : A Little 6 Click Score: 19    End of Session Equipment Utilized During Treatment: Gait belt Activity Tolerance: Patient tolerated treatment well;No increased pain Patient left: in chair;with call bell/phone within reach;with family/visitor present Nurse Communication: Mobility status;Other (comment) (pt readiness for d/c from PT standpoint) PT Visit Diagnosis: Unsteadiness on feet (R26.81);Muscle weakness (generalized) (M62.81);Difficulty in walking, not elsewhere classified  (R26.2);Pain;Other abnormalities of gait and mobility (R26.89) Pain - Right/Left: Right Pain - part of body: Knee;Leg    Time: 1610-9604 PT Time Calculation (min) (ACUTE ONLY): 39 min   Charges:   PT Evaluation $PT Eval Low Complexity: 1 Low PT Treatments $Gait Training: 8-22 mins $Therapeutic Exercise: 8-22 mins PT General Charges $$ ACUTE PT VISIT: 1 Visit         Cary Clarks, PT Acute Rehab   Annalee Kiang 09/03/2023, 5:19 PM

## 2023-09-03 NOTE — Anesthesia Postprocedure Evaluation (Signed)
 Anesthesia Post Note  Patient: Steven Bishop  Procedure(s) Performed: ARTHROPLASTY, KNEE, TOTAL (Right: Knee)     Patient location during evaluation: Phase II Anesthesia Type: Regional Level of consciousness: oriented, awake and alert and patient cooperative Pain management: pain level controlled Vital Signs Assessment: post-procedure vital signs reviewed and stable Respiratory status: spontaneous breathing, respiratory function stable and nonlabored ventilation Cardiovascular status: blood pressure returned to baseline and stable Postop Assessment: no headache, no backache, no apparent nausea or vomiting, spinal receding, patient able to bend at knees and adequate PO intake Anesthetic complications: no   There were no known notable events for this encounter.  Last Vitals:  Vitals:   09/03/23 1330 09/03/23 1345  BP: 127/66 126/60  Pulse: (!) 56 (!) 44  Resp:  13  Temp:    SpO2: 100% 98%    Last Pain:  Vitals:   09/03/23 1445  TempSrc:   PainSc: 0-No pain                 Nickcole Bralley,E. Luretha Eberly

## 2023-09-03 NOTE — Op Note (Signed)
 NAME:  Steven Bishop                      MEDICAL RECORD NO.:  [id number]                             FACILITY:  [facility]      PHYSICIAN:  Steven Bishop, M.D.  DATE OF BIRTH:  23-May-1945      DATE OF PROCEDURE:  09/03/2023                                     OPERATIVE REPORT         PREOPERATIVE DIAGNOSIS:  Right knee osteoarthritis.      POSTOPERATIVE DIAGNOSIS:  Right knee osteoarthritis.      FINDINGS:  The patient was noted to have complete loss of cartilage and   bone-on-bone arthritis with associated osteophytes in the medial and patellofemoral compartments of   the knee with a significant synovitis and associated effusion.  The patient had failed months of conservative treatment including medications, injection therapy, activity modification.     PROCEDURE:  Right total knee replacement.      COMPONENTS USED:  DePuy Attune FB CR MS knee   system, a size 7 femur, 8 tibia, size 6 mm CR MS AOX insert, and 38 anatomic patellar   button.      SURGEON:  Steven Bishop, M.D.      ASSISTANT:  Steven Pen, PA-C.      ANESTHESIA:  Regional and Spinal.      SPECIMENS:  None.      COMPLICATION:  None.      DRAINS:  None.  EBL: <200 cc      TOURNIQUET TIME:  32 min at 225 mmHg     The patient was stable to the recovery room.      INDICATION FOR PROCEDURE:  [name] is a 77 y.o. male patient of   mine.  The patient had been seen, evaluated, and treated for months conservatively in the   office with medication, activity modification, and injections.  The patient had   radiographic changes of bone-on-bone arthritis with endplate sclerosis and osteophytes noted.  Based on the radiographic changes and failed conservative measures, the patient   decided to proceed with definitive treatment, total knee replacement.  Risks of infection, DVT, component failure, need for revision surgery, neurovascular injury were reviewed in the office setting.  The postop course was reviewed  stressing the efforts to maximize post-operative satisfaction and function.  Consent was obtained for benefit of pain   relief.      PROCEDURE IN DETAIL:  The patient was brought to the operative theater.   Once adequate anesthesia, preoperative antibiotics, 2 gm of Ancef ,1 gm of Tranexamic Acid, and 10 mg of Decadron administered, the patient was positioned supine with a right thigh tourniquet placed.  The  right lower extremity was prepped and draped in sterile fashion.  A time-   out was performed identifying the patient, planned procedure, and the appropriate extremity.      The right lower extremity was placed in the [facility] leg holder.  The leg was   exsanguinated, tourniquet elevated to 225 mmHg.  A midline incision was   made followed by median parapatellar arthrotomy.  Following initial   exposure, attention was  first directed to the patella.  Precut   measurement was noted to be 27 mm.  I resected down to 15 mm and used a   38 anatomic patellar button to restore patellar height as well as cover the cut surface.      The lug holes were drilled and a metal shim was placed to protect the   patella from retractors and saw blade during the procedure.      At this point, attention was now directed to the femur.  The femoral   canal was opened with a drill, irrigated to try to prevent fat emboli.  An   intramedullary rod was passed at 5 degrees valgus, 9 mm of bone was   resected off the distal femur.  Following this resection, the tibia was   subluxated anteriorly.  Using the extramedullary guide, 2 mm of bone was resected off   the proximal medial tibia.  We confirmed the gap would be   stable medially and laterally with a size 5 spacer block as well as confirmed that the tibial cut was perpendicular in the coronal plane, checking with an alignment rod.      Once this was done, I sized the femur to be a size 7 in the anterior-   posterior dimension, chose a standard component based on  medial and   lateral dimension.  The size 7 rotation block was then pinned in   position anterior referenced using the C-clamp to set rotation.  The   anterior, posterior, and  chamfer cuts were made without difficulty nor   notching making certain that I was along the anterior cortex to help   with flexion gap stability.      The final box cut was made off the lateral aspect of distal femur.      At this point, the tibia was sized to be a size 8.  The size 8 tray was   then pinned in position through the medial third of the tubercle,   drilled, and keel punched.  Trial reduction was now carried with a 7 femur,  8 tibia, a size 6 mm CR MS insert, and the 38 anatomic patella botton.  The knee was brought to full extension with good flexion stability with the patella   tracking through the trochlea without application of pressure.  Given   all these findings the trial components removed.  Final components were   opened and cement was mixed.  The knee was irrigated with normal saline solution and pulse lavage.  The synovial lining was   then injected with 30 cc of 0.25% Marcaine  with epinephrine , 1 cc of Toradol and 30 cc of NS for a total of 61 cc.     Final implants were then cemented onto cleaned and dried cut surfaces of bone with the knee brought to extension with a size 6 mm CR MS trial insert.      Once the cement had fully cured, excess cement was removed   throughout the knee.  I confirmed that I was satisfied with the range of   motion and stability, and the final size 6 mm CR MS AOX insert was chosen.  It was   placed into the knee.      The tourniquet had been let down at 32 minutes.  No significant   hemostasis was required.  The extensor mechanism was then reapproximated using #1 Vicryl and #1 Stratafix sutures with the knee   in flexion.  The   remaining wound was closed with 2-0 Vicryl and running 4-0 Monocryl.   The knee was cleaned, dried, dressed sterilely using Dermabond  and   Aquacel dressing.  The patient was then   brought to recovery room in stable condition, tolerating the procedure   well.   Please note that Physician Assistant, Steven Pen, PA-C was present for the entirety of the case, and was utilized for pre-operative positioning, peri-operative retractor management, general facilitation of the procedure and for primary wound closure at the end of the case.              Steven Lento Bernard Bishop, M.D.    09/03/2023 8:39 AM

## 2023-09-03 NOTE — Discharge Instructions (Signed)

## 2023-09-03 NOTE — Transfer of Care (Signed)
 Immediate Anesthesia Transfer of Care Note  Patient: Steven Bishop  Procedure(s) Performed: ARTHROPLASTY, KNEE, TOTAL (Right: Knee)  Patient Location: PACU  Anesthesia Type:MAC, Regional, and Spinal  Level of Consciousness: awake, alert , and oriented  Airway & Oxygen Therapy: Patient Spontanous Breathing and Patient connected to nasal cannula oxygen  Post-op Assessment: Report given to RN and Post -op Vital signs reviewed and stable  Post vital signs: Reviewed and stable  Last Vitals:  Vitals Value Taken Time  BP 111/67 09/03/23 1130  Temp 97   Pulse 60 09/03/23 1131  Resp 15 09/03/23 1131  SpO2 96 % 09/03/23 1131  Vitals shown include unfiled device data.  Last Pain:  Vitals:   09/03/23 0930  TempSrc:   PainSc: 0-No pain         Complications: No notable events documented.

## 2023-09-03 NOTE — Anesthesia Procedure Notes (Signed)
 Procedure Name: MAC Date/Time: 09/03/2023 9:47 AM  Performed by: Marshall Skeeter, CRNAPre-anesthesia Checklist: Patient identified, Emergency Drugs available, Suction available, Patient being monitored and Timeout performed Patient Re-evaluated:Patient Re-evaluated prior to induction Oxygen Delivery Method: Simple face mask Placement Confirmation: positive ETCO2 Dental Injury: Teeth and Oropharynx as per pre-operative assessment

## 2023-09-03 NOTE — H&P (Signed)
 TOTAL KNEE ADMISSION H&P  Patient is being admitted for right total knee arthroplasty.  Therapy Plans: outpatient therapy at [facility] Disposition: Home with wife Planned DVT Prophylaxis: aspirin 81mg  BID DME needed: walker PCP: Dr. Robbin Chill - clearance received TXA: IV Allergies: NKDA Anesthesia Concerns: none BMI: 29.7 Last HgbA1c: 6.1%   Other: - Planning on SDD - oxycodone , robaxin  ,tylenol, meloxicam - hx of prostatectomy for prostate cancer - No hx of VTE  Subjective:  Chief Complaint:right knee pain.  HPI: Steven Bishop, 78 y.o. male, has a history of pain and functional disability in the right knee due to arthritis and has failed non-surgical conservative treatments for greater than 12 weeks to includeNSAID's and/or analgesics, corticosteriod injections, and activity modification.  Onset of symptoms was gradual, starting 2 years ago with gradually worsening course since that time. The patient noted no past surgery on the right knee(s).  Patient currently rates pain in the right knee(s) at 8 out of 10 with activity. Patient has worsening of pain with activity and weight bearing and pain that interferes with activities of daily living.  Patient has evidence of joint space narrowing by imaging studies. There is no active infection.  Patient Active Problem List   Diagnosis Date Noted   Prostate cancer [facility])    Distal radius fracture, right    Diabetes mellitus (HCC)    Complication of anesthesia    Chest pain 12/18/2018   Past Medical History:  Diagnosis Date   Arthritis    Chest pain 12/18/2018   Complication of anesthesia    " It takes a while for me to wake up."   Diabetes mellitus (HCC)    Distal radius fracture, right    Pneumonia    hx of 11/24   Prostate cancer [facility])     Past Surgical History:  Procedure Laterality Date   CERVICAL SPINE SURGERY     ORIF RADIAL FRACTURE Right 01/11/2015   Procedure: OPEN REDUCTION INTERNAL FIXATION (ORIF) RIGHT RADIAL  FRACTURE WITH REPAIR AND RECONSTRUCTION;  Surgeon: Ronn Cohn, MD;  Location: [facility] OR;  Service: Orthopedics;  Laterality: Right;   PROSTATECTOMY     TONSILLECTOMY AND ADENOIDECTOMY      No current facility-administered medications for this encounter.   Current Outpatient Medications  Medication Sig Dispense Refill Last Dose/Taking   albuterol (VENTOLIN HFA) 108 (90 Base) MCG/ACT inhaler Inhale 1-2 puffs into the lungs every 4 (four) hours as needed for wheezing or shortness of breath.   Taking As Needed   Ascorbic Acid (VITAMIN C) 1000 MG tablet Take 1,000 mg by mouth daily.   Taking   aspirin EC 81 MG tablet Take 81 mg by mouth daily.   Taking   Calcium  Carb-Cholecalciferol (CALCIUM  600 + D PO) Take 1 tablet by mouth daily.   Taking   Cholecalciferol (VITAMIN D3) 50 MCG (2000 UT) capsule Take 2,000 Units by mouth with breakfast, with lunch, and with evening meal.   Taking   KRILL OIL PO Take 600 mg by mouth daily.   Taking   Lutein 20 MG CAPS Take 20 mg by mouth daily.   Taking   nitroGLYCERIN  (NITROSTAT ) 0.4 MG SL tablet Place 0.4 mg under the tongue every 5 (five) minutes as needed for chest pain.   Taking As Needed   Omega-3 Fatty Acids (FISH OIL PO) Take 300 mg by mouth daily.   Taking   pioglitazone (ACTOS) 30 MG tablet Take 30 mg by mouth daily.   Taking  rosuvastatin  (CRESTOR ) 5 MG tablet Take 1 tablet (5 mg total) by mouth daily. 90 tablet 3 Taking   TURMERIC PO Take 1,000 mg by mouth daily.   Taking   vitamin E 180 MG (400 UNITS) capsule Take 400 Units by mouth daily.   Taking   Allergies  Allergen Reactions   Pravastatin  Other (See Comments)    Severe Constipation    Social History   Tobacco Use   Smoking status: Former    Current packs/day: 1.50    Average packs/day: 1.5 packs/day for 20.0 years (30.0 ttl pk-yrs)    Types: Cigarettes    Passive exposure: Past   Smokeless tobacco: Never  Substance Use Topics   Alcohol use: No    Family History  Problem Relation  Age of Onset   Macular degeneration Mother    Heart Problems Mother    Kidney failure Father    Heart Problems Sister      Review of Systems  Constitutional:  Negative for chills and fever.  Respiratory:  Negative for cough and shortness of breath.   Cardiovascular:  Negative for chest pain.  Gastrointestinal:  Negative for nausea and vomiting.  Musculoskeletal:  Positive for arthralgias.     Objective:  Physical Exam Well nourished and well developed. General: Alert and oriented x3, cooperative and pleasant, no acute distress.  Musculoskeletal: Right knee exam: No palpable effusion, warmth erythema Slight flexion contracture with flexion over 110 degrees Most of his tenderness is over the medial and anterior aspect knee Tightness with right hip exam but without groin pain or referred pain Neurovascular intact distally Left knee exam reveals mild tenderness to the medial aspect the knee but not as significant as the right knee.  Vital signs in last 24 hours:    Labs:   Estimated body mass index is 28.45 kg/m as calculated from the following:   Height as of 08/23/23: 5\' 11"  (1.803 m).   Weight as of 08/23/23: 92.5 kg.   Imaging Review Plain radiographs demonstrate severe degenerative joint disease of the right knee(s). The overall alignment isneutral. The bone quality appears to be adequate for age and reported activity level.      Assessment/Plan:  End stage arthritis, right knee   The patient history, physical examination, clinical judgment of the provider and imaging studies are consistent with end stage degenerative joint disease of the right knee(s) and total knee arthroplasty is deemed medically necessary. The treatment options including medical management, injection therapy arthroscopy and arthroplasty were discussed at length. The risks and benefits of total knee arthroplasty were presented and reviewed. The risks due to aseptic loosening, infection,  stiffness, patella tracking problems, thromboembolic complications and other imponderables were discussed. The patient acknowledged the explanation, agreed to proceed with the plan and consent was signed. Patient is being admitted for inpatient treatment for surgery, pain control, PT, OT, prophylactic antibiotics, VTE prophylaxis, progressive ambulation and ADL's and discharge planning. The patient is planning to be discharged home.     Patient's anticipated LOS is less than 2 midnights, meeting these requirements: - Younger than 82 - Lives within 1 hour of care - Has a competent adult at home to recover with post-op recover - NO history of  - Chronic pain requiring opiods  - Diabetes  - Coronary Artery Disease  - Heart failure  - Heart attack  - Stroke  - DVT/VTE  - Cardiac arrhythmia  - Respiratory Failure/COPD  - Renal failure  - Anemia  - Advanced Liver  disease  Kim Pen, PA-C Orthopedic Surgery [facility] [place] [phone]

## 2023-09-03 NOTE — Anesthesia Procedure Notes (Signed)
 Spinal  Patient location during procedure: OR End time: 09/03/2023 9:53 AM Reason for block: surgical anesthesia Staffing Performed: anesthesiologist  Anesthesiologist: Jonne Netters, MD Performed by: Jonne Netters, MD Authorized by: Jonne Netters, MD   Preanesthetic Checklist Completed: patient identified, IV checked, site marked, risks and benefits discussed, surgical consent, monitors and equipment checked, pre-op evaluation and timeout performed Spinal Block Patient position: sitting Prep: DuraPrep and site prepped and draped Patient monitoring: blood pressure, continuous pulse ox, cardiac monitor and heart rate Approach: midline Location: L3-4 Injection technique: single-shot Needle Needle type: Pencan and Introducer  Needle gauge: 24 G Needle length: 9 cm Assessment Events: CSF return Additional Notes Pt identified in Operating room.  Monitors applied. Working IV access confirmed. Sterile prep, drape lumbar spine.  1% lido local L 3,4.  #24ga Pencan into clear CSF L 3,4.  13.5 mg 0.75% Bupivacaine  with dextrose  injected with asp CSF beginning and end of injection.  Patient asymptomatic, VSS, no heme aspirated, tolerated well.  [name], MD

## 2023-09-04 ENCOUNTER — Encounter (HOSPITAL_COMMUNITY): Payer: Self-pay | Admitting: Orthopedic Surgery

## 2023-09-13 DIAGNOSIS — M25561 Pain in right knee: Secondary | ICD-10-CM | POA: Insufficient documentation

## 2023-09-15 DIAGNOSIS — M25661 Stiffness of right knee, not elsewhere classified: Secondary | ICD-10-CM | POA: Insufficient documentation

## 2023-09-19 ENCOUNTER — Encounter: Payer: Self-pay | Admitting: Cardiology

## 2023-10-04 ENCOUNTER — Encounter: Payer: Self-pay | Admitting: Cardiology

## 2023-10-21 ENCOUNTER — Ambulatory Visit: Admitting: Cardiology

## 2024-01-06 NOTE — Progress Notes (Unsigned)
 Cardiology Office Note:    Date:  01/09/2024   ID:  Steven Bishop, DOB 03-30-1946, MRN [id number]  PCP:  Sabas Norleen PARAS., MD  Cardiologist:  Redell Leiter, MD    Referring MD: Sabas Norleen PARAS., MD    ASSESSMENT:    1. Mild CAD   2. Coronary-myocardial bridge   3. Type 2 diabetes mellitus with complication, with long-term current use of insulin  (HCC)   4. Dyslipidemia    PLAN:    In order of problems listed above:  Steven Bishop has done well with ADD and has been reach that no intermittent GI discomfort vigorous active plays activities like golf, continues medical therapy including aspirin  high intensity statin with ideal lipids and nitroglycerin  if needed given a new prescription  well-controlled diabetes continue his oral agent he has a small amount of ankle edema not uncommon with Actos Ideal lipids continue his high intensity statin   Next appointment: Plan to see him in 1 year   Medication Adjustments/Labs and Tests Ordered: Current medicines are reviewed at length with the patient today.  Concerns regarding medicines are outlined above.  Orders Placed This Encounter  Procedures   EKG 12-Lead   VAS US  VASCUSCREEN   Meds ordered this encounter  Medications   aspirin  EC 81 MG tablet    Sig: Take 1 tablet (81 mg total) by mouth daily. Swallow whole.    Dispense:  90 tablet    Refill:  3     History of Present Illness:    Steven Bishop is a 78 y.o. male with a hx of coronary atherosclerosis with a calcium  score of 100.41/45th percentile nonobstructive CAD and myocardial coronary artery bridge left anterior descending coronary artery type 2 diabetes and dyslipidemia  last seen 09/11/2022.  Compliance with diet, lifestyle and medications: Yes  Steven Bishop is seen by me in the office he is scheduled for cataract surgery tomorrow He has a good quality of life he is not having any cardiovascular symptoms of chest pain edema shortness of breath palpitation or syncope Episodes of  being followed in his primary care physician's office he has no muscle pain or weakness  Lipid panel 10/25/2023 cholesterol 110 LDL 45 non-HDL cholesterol 60 Past Medical History:  Diagnosis Date   Arthralgia of right knee 09/13/2023   Arthritis    Chest pain 12/18/2018   Complication of anesthesia     It takes a while for me to wake up.   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   Ascorbic Acid (VITAMIN C) 1000 MG tablet Take 1,000 mg by mouth daily.   aspirin  EC 81 MG tablet Take 1 tablet (81 mg total) by mouth daily. Swallow whole.   Calcium  Carb-Cholecalciferol (CALCIUM  600 + D PO) Take 1 tablet by mouth daily.   Cholecalciferol (VITAMIN D3) 50 MCG (2000 UT) capsule Take 2,000 Units by mouth with breakfast, with lunch, and with evening meal.   ketorolac  (ACULAR ) 0.5 % ophthalmic solution Place 1 drop into the left eye 2 (two) times daily.  KRILL OIL PO Take 600 mg by mouth daily.   Lutein 20 MG CAPS Take 20 mg by mouth daily.   moxifloxacin (VIGAMOX) 0.5 % ophthalmic solution Apply 1 drop to eye.   nitroGLYCERIN  (NITROSTAT ) 0.4 MG SL tablet Place 0.4 mg under the tongue every 5 (five) minutes as needed for chest pain.   Omega-3 Fatty Acids (FISH OIL PO) Take 300 mg by mouth daily.   pioglitazone (ACTOS) 30 MG tablet Take 30 mg by mouth daily.   prednisoLONE acetate (PRED FORTE) 1 % ophthalmic suspension Place 2 drops into both eyes.   rosuvastatin  (CRESTOR ) 5 MG tablet Take 1 tablet (5 mg total) by mouth daily.   TURMERIC PO Take 1,000 mg by mouth daily.   vitamin E 180 MG  (400 UNITS) capsule Take 400 Units by mouth daily.      EKGs/Labs/Other Studies Reviewed:    The following studies were reviewed today:  Cardiac Studies & Procedures   ______________________________________________________________________________________________        Steven  LONG TERM Bishop (3-14 DAYS) 01/21/2019  Narrative An event Bishop was performed for 14 days beginning 12/25/2018 in order to assess syncope.  The rhythm throughout is sinus with minimum average and maximum heart rates of 45, 64 and 107 bpm.  The minimum rate is sinus bradycardia.  There were no pauses of 3 seconds or greater and no episodes of AV node or sinus node block.  Ventricular ectopy was rare with isolated PVCs couplets and bigeminy 7.7 seconds trigeminy 6.3 seconds.  Supraventricular arrhythmia was rare with APCs.  There were 13 brief runs of atrial premature contractions the longest 15 complexes at a rate of 109 bpm, atrial tachycardia.  The fastest 4 beats at a rate of 179 bpm.  There are no episodes of atrial fibrillation or flutter.  There were 2 diary events 1 with palpitation associated with infrequent PVC There were 5 triggered events 1 associated with infrequent PVC   Conclusion unremarkable 14-day ZIO Bishop to assess syncope, no significant bradycardia seen.  There is rare supraventricular and ventricular ectopy present and the triggered and diary events are predominantly unassociated with PVCs   CT SCANS  CT CORONARY MORPH W/CTA COR W/SCORE 01/13/2019  Addendum 01/15/2019 11:11 AM ADDENDUM REPORT: 01/15/2019 11:09  HISTORY: Chest pain, cardiac etiology suspected chest pain  EXAM: Cardiac/Coronary CT  TECHNIQUE: The patient was scanned on a [employer].  PROTOCOL: A 120 kV prospective scan was triggered in the descending thoracic aorta at 111 HU's. Axial non-contrast 3 mm slices were carried out through the heart. The data set was analyzed on a dedicated  work station and scored using the Agatson method. Gantry rotation speed was 250 msecs and collimation was 0.6 mm. Beta blockade and 0.8 mg of sl NTG was given. The 3D data set was reconstructed in 5% intervals of 35-75% of the R-R cycle. Diastolic phases were analyzed on a dedicated work station using MPR, MIP and VRT modes. The patient received 80mL OMNIPAQUE  IOHEXOL  350 MG/ML SOLN of contrast.  FINDINGS: Coronary calcium  score: The patient's coronary artery calcium  score is 141, which places the patient in the 45 percentile.  Coronary arteries: Normal coronary origins.  Right dominance.  Right Coronary Artery: Proximal RCA with calcified plaque with 1-24% stenosis. Mid RCA with calcified plaque with 1-24% stenosis. Distal RCA gives rise to PDA and PLB, no significant plaque or stenosis.  Left Main Coronary Artery: Short course, small calcified plaque at distal portion with 1-24% stenosis.  Left Anterior Descending Coronary Artery: No significant plaque or stenosis within LAD itself. Vessel gives rise to small D1 and medium D2 that has proximal calcified plaque with 25-49% stenosis. Caliber of vessel is approximately 2mm at the area of stenosis. There is a myocardial bridge in the proximal to mid LAD, approximately 20 mm long.  Left Circumflex Artery: Proximal LCx with two small areas of calcified plaque, each with 1-24% stenosis. Gives rise to large OM1 and OM2, no significant plaque or stenosis in OM branches.  Aorta: Normal size, 33 mm at the mid ascending aorta (level of the PA bifurcation) measured double oblique. Mild calcifications. No dissection.  Aortic Valve: Trivial calcifications. Trileaflet.  Other findings:  Normal pulmonary vein drainage into the left atrium.  Normal left atrial appendage without a thrombus.  Normal size of the pulmonary artery.  IMPRESSION: 1. 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  2. Coronary calcium  score of 141. This was 45  percentile for age and sex matched control.  3. Normal coronary origin with right dominance.  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   Electronically Signed By: Shelda Bruckner M.D. On: 01/15/2019 11:09  Narrative EXAM: OVER-READ INTERPRETATION  CT CHEST  The following report is an over-read performed by radiologist Dr. Rockey Kilts of [facility] on 01/13/2019. This over-read does not include interpretation of cardiac or coronary anatomy or pathology. The CTA interpretation by the cardiologist is attached.  COMPARISON:  12/03/2018 chest radiograph.  FINDINGS: Vascular: Normal aortic caliber, without evidence of dissection. No central pulmonary embolism, on this non-dedicated study.  Mediastinum/Nodes: No imaged thoracic adenopathy.  Lungs/Pleura: No imaged pleural fluid. Mild centrilobular emphysema.  Upper Abdomen: Normal imaged portions of the spleen. Proximal gastric underdistention. Tiny low-density liver lesions are likely cysts or bile duct hamartomas.  Musculoskeletal: Posterior right rib remote fracture on 33/11. Moderate mid and lower thoracic spondylosis.  IMPRESSION: No acute findings in the imaged extracardiac chest.  Emphysema (ICD10-J43.9).  Electronically Signed: By: Rockey Kilts M.D. On: 01/13/2019 09:14     ______________________________________________________________________________________________             Recent Labs: 08/23/2023: BUN 19; Creatinine, Ser 1.00; Hemoglobin 12.9; Platelets 155; Potassium 4.3; Sodium 139  Recent Lipid Panel    Component Value Date/Time   CHOL 117 03/09/2020 1453   TRIG 271 (H) 03/09/2020 1453   HDL 42 03/09/2020 1453   CHOLHDL 2.8 03/09/2020 1453   LDLCALC 34 03/09/2020 1453    Physical Exam:    VS:  BP (!) 118/50   Pulse 65   Ht 5' 11 (1.803 m)   Wt 211 lb 6.4 oz (95.9 kg)   SpO2 97%   BMI 29.48 kg/m     Wt Readings from Last 3 Encounters:  01/09/24 211 lb 6.4 oz  (95.9 kg)  09/03/23 204 lb (92.5 kg)  08/23/23 204 lb (92.5 kg)     GEN:  Well nourished, well developed in no acute distress HEENT: Normal NECK: No JVD; No carotid bruits LYMPHATICS: No lymphadenopathy CARDIAC: RRR, no murmurs, rubs, gallops RESPIRATORY:  Clear to auscultation without rales, wheezing or rhonchi  ABDOMEN: Soft, non-tender, non-distended MUSCULOSKELETAL:  No edema; No deformity  SKIN: Warm and dry NEUROLOGIC:  Alert and oriented x 3 PSYCHIATRIC:  Normal affect    Signed, Redell Leiter, MD  01/09/2024 11:16 AM    [facility]

## 2024-01-08 DIAGNOSIS — J189 Pneumonia, unspecified organism: Secondary | ICD-10-CM | POA: Insufficient documentation

## 2024-01-08 DIAGNOSIS — M1990 Unspecified osteoarthritis, unspecified site: Secondary | ICD-10-CM | POA: Insufficient documentation

## 2024-01-09 ENCOUNTER — Encounter: Payer: Self-pay | Admitting: Cardiology

## 2024-01-09 ENCOUNTER — Ambulatory Visit: Attending: Cardiology | Admitting: Cardiology

## 2024-01-09 VITALS — BP 118/50 | HR 65 | Ht 71.0 in | Wt 211.4 lb

## 2024-01-09 DIAGNOSIS — Q245 Malformation of coronary vessels: Secondary | ICD-10-CM

## 2024-01-09 DIAGNOSIS — I2510 Atherosclerotic heart disease of native coronary artery without angina pectoris: Secondary | ICD-10-CM

## 2024-01-09 DIAGNOSIS — E118 Type 2 diabetes mellitus with unspecified complications: Secondary | ICD-10-CM | POA: Diagnosis not present

## 2024-01-09 DIAGNOSIS — E785 Hyperlipidemia, unspecified: Secondary | ICD-10-CM

## 2024-01-09 DIAGNOSIS — Z794 Long term (current) use of insulin: Secondary | ICD-10-CM

## 2024-01-09 MED ORDER — ASPIRIN 81 MG PO TBEC: 81.0000 mg | DELAYED_RELEASE_TABLET | Freq: Every day | ORAL | 3 refills | Status: AC

## 2024-01-09 NOTE — Patient Instructions (Signed)
 Medication Instructions:  Your physician has recommended you make the following change in your medication:  START: Aspirin  81 mg daily  *If you need a refill on your cardiac medications before your next appointment, please call your pharmacy*  Lab Work: None If you have labs (blood work) drawn today and your tests are completely normal, you will receive your results only by: MyChart Message (if you have MyChart) OR A paper copy in the mail If you have any lab test that is abnormal or we need to change your treatment, we will call you to review the results.  Testing/Procedures: Vascular screening  Follow-Up: At [facility], you and your health needs are our priority.  As part of our continuing mission to provide you with exceptional heart care, our providers are all part of one team.  This team includes your primary Cardiologist (physician) and Advanced Practice Providers or APPs (Physician Assistants and Nurse Practitioners) who all work together to provide you with the care you need, when you need it.  Your next appointment:   1 year(s)  Provider:   Redell Leiter, MD    We recommend signing up for the patient portal called MyChart.  Sign up information is provided on this After Visit Summary.  MyChart is used to connect with patients for Virtual Visits (Telemedicine).  Patients are able to view lab/test results, encounter notes, upcoming appointments, etc.  Non-urgent messages can be sent to your provider as well.   To learn more about what you can do with MyChart, go to [email or web address].   Other Instructions None

## 2024-02-04 ENCOUNTER — Ambulatory Visit: Payer: Self-pay | Admitting: Cardiology

## 2024-02-04 ENCOUNTER — Ambulatory Visit: Attending: Cardiology

## 2024-02-04 DIAGNOSIS — I2510 Atherosclerotic heart disease of native coronary artery without angina pectoris: Secondary | ICD-10-CM

## 2024-02-17 ENCOUNTER — Encounter (HOSPITAL_COMMUNITY): Payer: Self-pay | Admitting: Cardiology
